# Patient Record
Sex: Male | Born: 1965 | Race: White | Hispanic: No | Marital: Married | Smoking: Never smoker
Health system: Southern US, Community
[De-identification: ages and names within clinical notes are randomized; demographics above are authoritative.]

## PROBLEM LIST (undated history)

## (undated) DIAGNOSIS — L739 Follicular disorder, unspecified: Secondary | ICD-10-CM

## (undated) DIAGNOSIS — IMO0002 Reserved for concepts with insufficient information to code with codable children: Secondary | ICD-10-CM

## (undated) DIAGNOSIS — M419 Scoliosis, unspecified: Secondary | ICD-10-CM

## (undated) DIAGNOSIS — E785 Hyperlipidemia, unspecified: Secondary | ICD-10-CM

## (undated) DIAGNOSIS — R740 Nonspecific elevation of levels of transaminase and lactic acid dehydrogenase [LDH]: Secondary | ICD-10-CM

## (undated) HISTORY — DX: Hyperlipidemia, unspecified: E78.5

## (undated) HISTORY — PX: WISDOM TOOTH EXTRACTION: SHX21

## (undated) HISTORY — PX: ADENOIDECTOMY: SUR15

## (undated) HISTORY — DX: Follicular disorder, unspecified: L73.9

## (undated) HISTORY — DX: Nonspecific elevation of levels of transaminase and lactic acid dehydrogenase (ldh): R74.0

## (undated) HISTORY — DX: Reserved for concepts with insufficient information to code with codable children: IMO0002

## (undated) HISTORY — DX: Scoliosis, unspecified: M41.9

---

## 1993-02-06 HISTORY — PX: COLONOSCOPY: SHX174

## 2006-10-01 ENCOUNTER — Ambulatory Visit: Payer: Self-pay | Admitting: Internal Medicine

## 2008-07-21 ENCOUNTER — Ambulatory Visit: Payer: Self-pay | Admitting: Internal Medicine

## 2008-07-21 DIAGNOSIS — E785 Hyperlipidemia, unspecified: Secondary | ICD-10-CM | POA: Insufficient documentation

## 2008-07-21 DIAGNOSIS — L738 Other specified follicular disorders: Secondary | ICD-10-CM | POA: Insufficient documentation

## 2008-07-21 LAB — CONVERTED CEMR LAB
Bilirubin Urine: NEGATIVE
Glucose, Urine, Semiquant: NEGATIVE
Ketones, urine, test strip: NEGATIVE
Nitrite: NEGATIVE
WBC Urine, dipstick: NEGATIVE

## 2009-09-06 DIAGNOSIS — R7401 Elevation of levels of liver transaminase levels: Secondary | ICD-10-CM

## 2009-09-06 DIAGNOSIS — R7402 Elevation of levels of lactic acid dehydrogenase (LDH): Secondary | ICD-10-CM

## 2009-09-06 HISTORY — DX: Elevation of levels of liver transaminase levels: R74.01

## 2009-09-06 HISTORY — DX: Elevation of levels of lactic acid dehydrogenase (LDH): R74.02

## 2009-09-21 ENCOUNTER — Ambulatory Visit: Payer: Self-pay | Admitting: Internal Medicine

## 2009-09-21 DIAGNOSIS — R3919 Other difficulties with micturition: Secondary | ICD-10-CM | POA: Insufficient documentation

## 2009-09-21 DIAGNOSIS — R17 Unspecified jaundice: Secondary | ICD-10-CM | POA: Insufficient documentation

## 2009-09-22 ENCOUNTER — Encounter: Payer: Self-pay | Admitting: Internal Medicine

## 2009-09-22 DIAGNOSIS — R740 Nonspecific elevation of levels of transaminase and lactic acid dehydrogenase [LDH]: Secondary | ICD-10-CM

## 2009-09-22 DIAGNOSIS — R7401 Elevation of levels of liver transaminase levels: Secondary | ICD-10-CM | POA: Insufficient documentation

## 2009-09-22 LAB — CONVERTED CEMR LAB
ALT: 474 units/L — ABNORMAL HIGH (ref 0–53)
AST: 162 units/L — ABNORMAL HIGH (ref 0–37)
Alkaline Phosphatase: 377 units/L — ABNORMAL HIGH (ref 39–117)
BUN: 13 mg/dL (ref 6–23)
Basophils Absolute: 0.1 10*3/uL (ref 0.0–0.1)
Basophils Relative: 1.2 % (ref 0.0–3.0)
Creatinine, Ser: 0.9 mg/dL (ref 0.4–1.5)
HCT: 42.7 % (ref 39.0–52.0)
HCV Ab: NEGATIVE
Hepatitis B Surface Ag: NEGATIVE
Lymphocytes Relative: 16.7 % (ref 12.0–46.0)
Lymphs Abs: 0.9 10*3/uL (ref 0.7–4.0)
MCHC: 34.8 g/dL (ref 30.0–36.0)
MCV: 98.4 fL (ref 78.0–100.0)
Monocytes Absolute: 0.5 10*3/uL (ref 0.1–1.0)
Neutro Abs: 3.4 10*3/uL (ref 1.4–7.7)
Neutrophils Relative %: 65.2 % (ref 43.0–77.0)
Platelets: 270 10*3/uL (ref 150.0–400.0)

## 2009-09-23 ENCOUNTER — Encounter: Admission: RE | Admit: 2009-09-23 | Discharge: 2009-09-23 | Payer: Self-pay | Admitting: Internal Medicine

## 2009-09-23 ENCOUNTER — Ambulatory Visit: Payer: Self-pay | Admitting: Internal Medicine

## 2009-09-24 LAB — CONVERTED CEMR LAB
ALT: 300 units/L — ABNORMAL HIGH (ref 0–53)
AST: 79 units/L — ABNORMAL HIGH (ref 0–37)
Alkaline Phosphatase: 289 units/L — ABNORMAL HIGH (ref 39–117)
Bilirubin, Direct: 0.7 mg/dL — ABNORMAL HIGH (ref 0.0–0.3)

## 2009-09-27 ENCOUNTER — Ambulatory Visit: Payer: Self-pay | Admitting: Internal Medicine

## 2009-09-28 LAB — CONVERTED CEMR LAB
Alkaline Phosphatase: 190 units/L — ABNORMAL HIGH (ref 39–117)
Bilirubin, Direct: 0.5 mg/dL — ABNORMAL HIGH (ref 0.0–0.3)

## 2009-10-06 ENCOUNTER — Ambulatory Visit: Payer: Self-pay | Admitting: Internal Medicine

## 2009-10-10 ENCOUNTER — Encounter: Payer: Self-pay | Admitting: Internal Medicine

## 2009-10-12 LAB — CONVERTED CEMR LAB
Albumin: 4.2 g/dL (ref 3.5–5.2)
Total Bilirubin: 1.2 mg/dL (ref 0.3–1.2)

## 2009-10-20 ENCOUNTER — Ambulatory Visit (HOSPITAL_COMMUNITY): Admission: RE | Admit: 2009-10-20 | Discharge: 2009-10-20 | Payer: Self-pay | Admitting: Internal Medicine

## 2010-03-06 LAB — CONVERTED CEMR LAB
Blood in Urine, dipstick: NEGATIVE
Protein, U semiquant: NEGATIVE
Urobilinogen, UA: NEGATIVE
WBC Urine, dipstick: NEGATIVE

## 2010-03-08 NOTE — Miscellaneous (Signed)
Summary: Orders Update   Clinical Lists Changes  Problems: Added new problem of NONSPEC ELEVATION OF LEVELS OF TRANSAMINASE/LDH (ICD-790.4) Orders: Added new Referral order of Radiology Referral (Radiology) - Signed 

## 2010-03-08 NOTE — Assessment & Plan Note (Signed)
Summary: urine dark- white of eyes yellow/cbs   Vital Signs:  Patient profile:   45 year old male Height:      69.25 inches (175.90 cm) Weight:      184.50 pounds (83.86 kg) BMI:     27.15 Temp:     98.3 degrees F (36.83 degrees C) oral Pulse rate:   88 / minute Resp:     14 per minute BP sitting:   118 / 80  (left arm) Cuff size:   large  Vitals Entered By: Brenton Grills MA (September 21, 2009 8:09 AM) CC: Dark Urine/Whites of Eyes Yellow after vacation/aj   CC:  Dark Urine/Whites of Eyes Yellow after vacation/aj.  History of Present Illness: His children noted "yellow " of his eyes 09/12/2009. On 09/09/2009 he had marked diarrhea X three   1 hr post lunch of seared Ahi tuna @ steak house in [facility]. No others  @ lunch got ill.On 08/05 he had fever to 101.2 sublingually; temp was 100 on Sat 08/06. He flew to [facility] 08/07; he had malaise that day  & night sweats that night. N&V X 1 on 08/07. ? icterus 08/7 & dark urine 08/08. Stools chalky 08/08 or 08/09. He had anorexia last week; N&V X1 on 08/11. He has felt better since. See ROS for present symptoms/ signs.No foreign travel in > 12 mony=ths.  Preventive Screening-Counseling & Management      Drug Use:  no.    Current Medications (verified): 1)  None  Allergies (verified): No Known Drug Allergies  Social History: no diet Occupation: Psychologist, educational Married Former Smoker: in college Alcohol use-yes: socially Regular exercise-no Drug use-no Drug Use:  no  Review of Systems General:  Denies chills, fever, and sweats; no significant weight loss overall. ENT:  Denies difficulty swallowing, hoarseness, nasal congestion, and sinus pressure. Resp:  Denies cough and sputum productive. GI:  Denies abdominal pain, bloody stools, dark tarry stools, and indigestion; Recent intoleance to greasy foods. GU:  Denies discharge, dysuria, and hematuria. MS:  Denies joint pain, joint redness, and joint swelling. Derm:  Denies lesion(s)  and rash.  Physical Exam  General:  well-nourished,in no acute distress; alert,appropriate and cooperative throughout examination Eyes:  No corneal or conjunctival inflammation noted.Perrla. No definite icterus Mouth:  Oral mucosa and oropharynx without lesions or exudates.  Teeth in good repair. No pharyngeal erythema.  Tongue moist Neck:  No deformities, masses, or tenderness noted. Lungs:  Normal respiratory effort, chest expands symmetrically. Lungs are clear to auscultation, no crackles or wheezes. Heart:  Normal rate and regular rhythm. S1 and S2 normal without gallop, murmur, click, rub or other extra sounds. Abdomen:  Bowel sounds positive,abdomen soft and non-tender  even to percussion without masses, organomegaly or hernias noted. Msk:  Asymmetry of paraspinus muscles , R > L Pulses:  R and L carotid,radial,dorsalis pedis and posterior tibial pulses are full and equal bilaterally Extremities:  No clubbing, cyanosis, edema, or deformity noted with normal full range of motion of all joints.   Neurologic:  alert & oriented X3.   Skin:  Intact without suspicious lesions or rashes. No jaundice. No tenting Cervical Nodes:  No lymphadenopathy noted Axillary Nodes:  No palpable lymphadenopathy Psych:  memory intact for recent and remote, normally interactive, and good eye contact.     Impression & Recommendations:  Problem # 1:  ICTERUS (ICD-782.4)  Clinically inapparent @ present  Orders: Venipuncture (16109) TLB-CBC Platelet - w/Differential (85025-CBCD) TLB-Hepatic/Liver Function Pnl (80076-HEPATIC) TLB-Udip  w/ Micro (81001-URINE) T-Hepatitis Acute Panel (30160-10932)  Problem # 2:  DIARRHEA (ICD-787.91) Assessment: Unchanged  resolved  Orders: Venipuncture (35573) TLB-CBC Platelet - w/Differential (85025-CBCD) TLB-Hepatic/Liver Function Pnl (80076-HEPATIC) TLB-Udip w/ Micro (81001-URINE) T-Hepatitis Acute Panel (22025-42706) TLB-Creatinine, Blood  (82565-CREA) TLB-Potassium (K+) (84132-K) TLB-BUN (Urea Nitrogen) (84520-BUN)  Problem # 3:  FEVER (ICD-780.60)  resolved  Orders: Venipuncture (23762) TLB-CBC Platelet - w/Differential (85025-CBCD) TLB-Hepatic/Liver Function Pnl (80076-HEPATIC) TLB-Udip w/ Micro (81001-URINE) T-Hepatitis Acute Panel (83151-76160)  Problem # 4:  DARK URINE (ICD-788.69)  resolving  Orders: Venipuncture (73710) TLB-CBC Platelet - w/Differential (85025-CBCD) TLB-Hepatic/Liver Function Pnl (80076-HEPATIC) TLB-Udip w/ Micro (81001-URINE) T-Hepatitis Acute Panel (62694-85462)  Patient Instructions: 1)  [place] of [place] will depend  upon these results.  Appended Document: urine dark- white of eyes yellow/cbs   Appended Document: urine dark- white of eyes yellow/cbs  Laboratory Results   Urine Tests   Date/Time Reported: September 21, 2009 10:00 AM   Routine Urinalysis   Color: orange Appearance: Clear Glucose: negative   (Normal Range: Negative) Bilirubin: negative   (Normal Range: Negative) Ketone: negative   (Normal Range: Negative) Spec. Gravity: 1.025   (Normal Range: 1.003-1.035) Blood: negative   (Normal Range: Negative) pH: 5.0   (Normal Range: 5.0-8.0) Protein: negative   (Normal Range: Negative) Urobilinogen: negative   (Normal Range: 0-1) Nitrite: negative   (Normal Range: Negative) Leukocyte Esterace: negative   (Normal Range: Negative)    Comments: Floydene Flock  September 21, 2009 10:00 AM

## 2010-03-08 NOTE — Miscellaneous (Signed)
Summary: Orders Update   Clinical Lists Changes  Orders: Added new Referral order of Radiology Referral (Radiology) - Signed 

## 2010-09-06 ENCOUNTER — Encounter: Payer: Self-pay | Admitting: Internal Medicine

## 2010-09-07 ENCOUNTER — Encounter: Payer: Self-pay | Admitting: Internal Medicine

## 2010-09-07 ENCOUNTER — Ambulatory Visit (INDEPENDENT_AMBULATORY_CARE_PROVIDER_SITE_OTHER): Payer: PRIVATE HEALTH INSURANCE | Admitting: Internal Medicine

## 2010-09-07 VITALS — BP 128/80 | HR 68 | Temp 98.4°F | Ht 69.25 in | Wt 185.0 lb

## 2010-09-07 DIAGNOSIS — Z0000 Encounter for general adult medical examination without abnormal findings: Secondary | ICD-10-CM

## 2010-09-07 DIAGNOSIS — E785 Hyperlipidemia, unspecified: Secondary | ICD-10-CM

## 2010-09-07 DIAGNOSIS — R7401 Elevation of levels of liver transaminase levels: Secondary | ICD-10-CM

## 2010-09-07 LAB — POCT URINALYSIS DIPSTICK
Bilirubin, UA: NEGATIVE
Glucose, UA: NEGATIVE
Leukocytes, UA: NEGATIVE
Nitrite, UA: NEGATIVE
Spec Grav, UA: 1.01
Urobilinogen, UA: 0.2
pH, UA: 7.5

## 2010-09-07 LAB — CBC WITH DIFFERENTIAL/PLATELET
Basophils Absolute: 0 10*3/uL (ref 0.0–0.1)
Eosinophils Absolute: 0.2 10*3/uL (ref 0.0–0.7)
Eosinophils Relative: 4 % (ref 0.0–5.0)
HCT: 43.8 % (ref 39.0–52.0)
Lymphs Abs: 1.3 10*3/uL (ref 0.7–4.0)
MCHC: 34.7 g/dL (ref 30.0–36.0)
MCV: 96.2 fl (ref 78.0–100.0)
Platelets: 218 10*3/uL (ref 150.0–400.0)
RDW: 13.1 % (ref 11.5–14.6)
WBC: 4.9 10*3/uL (ref 4.5–10.5)

## 2010-09-07 LAB — LIPID PANEL: Cholesterol: 272 mg/dL — ABNORMAL HIGH (ref 0–200)

## 2010-09-07 LAB — BASIC METABOLIC PANEL
BUN: 19 mg/dL (ref 6–23)
Calcium: 9 mg/dL (ref 8.4–10.5)
Creatinine, Ser: 1 mg/dL (ref 0.4–1.5)
Potassium: 4 mEq/L (ref 3.5–5.1)
Sodium: 141 mEq/L (ref 135–145)

## 2010-09-07 LAB — TSH: TSH: 0.35 u[IU]/mL (ref 0.35–5.50)

## 2010-09-07 NOTE — Progress Notes (Signed)
Subjective:    Patient ID: Preston Lambert, male    DOB: 08-23-1965, 45 y.o.   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     Review of Systems  While on vacation in the [place] in August of 2011 he had scleral icterus and dark urine without abdominal pain. Liver enzymes were elevated. His ALT was 474,AST 162,& Alkaline phosphatase  379. An extensive evaluation was negative including a papida  gallbladder scan. By August 31 his ALT , 38 , AST 24 &  alkaline phosphatase 116. He is asymptomatic. Patient reports no  vision/ hearing changes,anorexia, weight change, fever ,adenopathy, persistant / recurrent hoarseness, swallowing issues, chest pain,palpitations, edema,persistant / recurrent cough, hemoptysis, dyspnea(rest, exertional, paroxysmal nocturnal), gastrointestinal  bleeding (melena, rectal bleeding), abdominal pain, excessive heart burn, GU symptoms( dysuria, hematuria, pyuria, color change) syncope, focal weakness, memory loss,numbness & tingling, skin/hair/nail changes,depression, anxiety, abnormal bruising/bleeding, or  musculoskeletal symptoms/signs.      Objective:   Physical Exam Gen.: Healthy and well-nourished in appearance. Alert, appropriate and cooperative throughout exam. Head: Normocephalic without obvious abnormalities;  No  alopecia  Eyes: No corneal or conjunctival inflammation noted. Pupils equal round reactive to light and accommodation. Fundal exam is benign without hemorrhages, exudate, papilledema. Extraocular motion intact. Vision grossly normal. No icterus Ears: External  ear exam reveals no significant lesions or deformities. Canals clear .TMs normal. Hearing is grossly normal bilaterally. Nose: External nasal exam reveals no deformity or inflammation. Nasal mucosa are pink and moist. No lesions or exudates noted. Septum  normal  Mouth: Oral mucosa and oropharynx reveal no lesions or exudates. Teeth in good  repair. Neck: No deformities, masses, or tenderness noted. Range of motion &. Thyroid normal. Lungs: Normal respiratory effort; chest expands symmetrically. Lungs are clear to auscultation without rales, wheezes, or increased work of breathing. Heart: Normal rate and rhythm. Normal S1 and S2. No gallop, click, or rub. No  murmur. Abdomen: Bowel sounds normal; abdomen soft and nontender. No masses, organomegaly or hernias noted. Genitalia: normal. DRE deferred   .                                                                                   Musculoskeletal/extremities: No deformity or scoliosis noted of  the thoracic or lumbar spine but asymmetry of thoracic muscles (R>L)Note: PMH of scoliosis. No clubbing, cyanosis, edema, or deformity noted. Range of motion  normal .Tone & strength  normal.Joints normal. Nail health  good. Vascular: Carotid, radial artery, dorsalis pedis and  posterior tibial pulses are full and equal. No bruits present. Neurologic: Alert and oriented x3. Deep tendon reflexes symmetrical and normal.          Skin: Intact without suspicious lesions or rashes. No jaundice Lymph: No cervical, axillary, or inguinal lymphadenopathy present. Psych: Mood and affect are normal. Normally interactive  Assessment & Plan:  #1 comprehensive physical exam; no acute findings #2 see Problem List with Assessments & Recommendations #3 past medical history of self-limited jaundice and liver enzyme rise in August/2011. Plan: see Orders  Note:EKG normal

## 2010-09-07 NOTE — Patient Instructions (Addendum)
Preventive Health Care: Exercise at least 30-45 minutes a day,  3-4 days a week.  Eat a low-fat diet with lots of fruits and vegetables, up to 7-9 servings per day. Consume less than 40 grams of sugar per day from foods & drinks with High Fructose Corn Sugar as #1,2,3 or # 4 on label. To prevent palpitations or premature beats, avoid stimulants such as decongestants, diet pills, nicotine, or caffeine (coffee, tea, cola, or chocolate) to excess.

## 2010-09-08 LAB — HEPATIC FUNCTION PANEL
ALT: 25 U/L (ref 0–53)
AST: 18 U/L (ref 0–37)
Alkaline Phosphatase: 63 U/L (ref 39–117)
Total Protein: 7.1 g/dL (ref 6.0–8.3)

## 2010-09-08 LAB — LDL CHOLESTEROL, DIRECT: Direct LDL: 191.1 mg/dL

## 2011-05-08 ENCOUNTER — Ambulatory Visit (INDEPENDENT_AMBULATORY_CARE_PROVIDER_SITE_OTHER): Payer: PRIVATE HEALTH INSURANCE | Admitting: Internal Medicine

## 2011-05-08 ENCOUNTER — Encounter: Payer: Self-pay | Admitting: Internal Medicine

## 2011-05-08 VITALS — BP 122/68 | HR 69 | Temp 98.7°F | Resp 18 | Ht 70.0 in | Wt 184.0 lb

## 2011-05-08 DIAGNOSIS — E785 Hyperlipidemia, unspecified: Secondary | ICD-10-CM

## 2011-05-08 DIAGNOSIS — R7401 Elevation of levels of liver transaminase levels: Secondary | ICD-10-CM

## 2011-05-08 NOTE — Progress Notes (Signed)
  Subjective:    Patient ID: Preston Lambert, male    DOB: April 15, 1965, 46 y.o.   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      Review of Systems Patient reports no  vision/ hearing changes,anorexia, weight change, fever ,adenopathy, persistant / recurrent hoarseness, swallowing issues, chest pain,palpitations, edema,persistant / recurrent cough, hemoptysis, dyspnea(rest, exertional, paroxysmal nocturnal), gastrointestinal  bleeding (melena, rectal bleeding), abdominal pain, excessive heart burn, GU symptoms( dysuria, hematuria, pyuria, voiding/incontinence  issues) syncope, focal weakness, memory loss,numbness & tingling, skin/hair/nail changes,depression, anxiety, abnormal bruising/bleeding,or  musculoskeletal symptoms/signs.      Objective:   Physical Exam Gen.: Healthy and well-nourished in appearance. Alert, appropriate and cooperative throughout exam. Head: Normocephalic without obvious abnormalities;  no alopecia  Eyes: No corneal or conjunctival inflammation noted. Pupils equal round reactive to light and accommodation. Fundal exam is benign without hemorrhages, exudate, papilledema. Extraocular motion intact. Vision grossly normal.FOV normal Ears: External  ear exam reveals no significant lesions or deformities. Canals clear .TMs normal. Hearing is grossly normal bilaterally. Nose: External nasal exam reveals no deformity or inflammation. Nasal mucosa are pink and moist. No lesions or exudates noted.   Mouth: Oral mucosa and oropharynx reveal no lesions or exudates. Teeth in good repair. Neck: No deformities, masses, or tenderness noted. Range of motion & Thyroid normal. Lungs: Normal respiratory effort; chest expands symmetrically. Lungs are clear to auscultation without rales, wheezes, or increased work of breathing. Heart: Normal rate and rhythm. Normal S1 and S2. No gallop, click, or rub. S 4 w/o murmur. Abdomen:  Bowel sounds normal; abdomen soft and nontender. No masses, organomegaly or hernias noted. Genitalia/ DRE: Granuloma L > R ; prostate is upper limits of normal without asymmetry, nodularity, or induration. Musculoskeletal/extremities: Mild asymmetry  noted of  the thoracic musculature. No clubbing, cyanosis, edema, or deformity noted. Range of motion  normal .Tone & strength  normal.Joints normal. Nail health  good. Vascular: Carotid, radial artery, dorsalis pedis and  posterior tibial pulses are full and equal. No bruits present. Neurologic: Alert and oriented x3. Deep tendon reflexes symmetrical and normal.          Skin: Intact without suspicious lesions or rashes. Lymph: No cervical, axillary, or inguinal lymphadenopathy present. Psych: Mood and affect are normal. Normally interactive                                                                                         Assessment & Plan:  #1 comprehensive physical exam; no acute findings #2 see Problem List with Assessments & Recommendations Plan: see Orders

## 2011-05-08 NOTE — Patient Instructions (Addendum)
Please review Dr Gildardo Griffes book Eat, Drink & Be Healthy for dietary cholesterol information. Please  schedule fasting Labs : NMR Lipoprofile Lipid Panel & hepatic panel  after 4 months of dietary changes to optimally assess LDL risk.  PLEASE BRING THESE INSTRUCTIONS TO FOLLOW UP  LAB APPOINTMENT.This will guarantee correct labs are drawn, eliminating need for repeat blood sampling ( needle sticks ! ). Diagnoses /Codes: 272.4,790.4 Exercise at least 30-45 minutes a day,  3-4 days a week.  Eat a low-fat diet with lots of fruits and vegetables, up to 7-9 servings per day. Consume less than 40 grams of sugar per day from foods & drinks with High Fructose Corn Sugar as # 1,2,3 or # 4 on label.

## 2013-04-15 ENCOUNTER — Encounter: Payer: Self-pay | Admitting: Emergency Medicine

## 2013-04-15 ENCOUNTER — Emergency Department (INDEPENDENT_AMBULATORY_CARE_PROVIDER_SITE_OTHER)
Admission: EM | Admit: 2013-04-15 | Discharge: 2013-04-15 | Disposition: A | Discharge status: Home / Self Care | Payer: No Typology Code available for payment source | Source: Home / Self Care | Attending: Emergency Medicine | Admitting: Emergency Medicine

## 2013-04-15 DIAGNOSIS — J0100 Acute maxillary sinusitis, unspecified: Secondary | ICD-10-CM

## 2013-04-15 MED ORDER — AMOXICILLIN-POT CLAVULANATE 875-125 MG PO TABS: 1.0000 | ORAL_TABLET | Freq: Two times a day (BID) | ORAL | Status: DC

## 2013-04-15 MED ORDER — PROMETHAZINE-CODEINE 6.25-10 MG/5ML PO SYRP: ORAL_SOLUTION | ORAL | Status: DC

## 2013-04-15 NOTE — ED Notes (Signed)
Pt c/o nasal congestion, productive cough and hoarseness x 1 day. He reports having a URI 3wks ago that has resolved. Denies fever. He took Sudafed yesterday.
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
## 2013-08-12 ENCOUNTER — Encounter: Payer: Self-pay | Admitting: Internal Medicine

## 2013-08-12 ENCOUNTER — Ambulatory Visit (INDEPENDENT_AMBULATORY_CARE_PROVIDER_SITE_OTHER): Payer: No Typology Code available for payment source | Admitting: Internal Medicine

## 2013-08-12 VITALS — BP 142/72 | HR 84 | Temp 98.0°F | Ht 69.25 in | Wt 184.8 lb

## 2013-08-12 DIAGNOSIS — R9431 Abnormal electrocardiogram [ECG] [EKG]: Secondary | ICD-10-CM

## 2013-08-12 DIAGNOSIS — E785 Hyperlipidemia, unspecified: Secondary | ICD-10-CM

## 2013-08-12 DIAGNOSIS — Z0000 Encounter for general adult medical examination without abnormal findings: Secondary | ICD-10-CM

## 2013-08-12 NOTE — Progress Notes (Signed)
Pre visit review using our clinic review tool, if applicable. No additional management support is needed unless otherwise documented below in the visit note. 

## 2013-08-12 NOTE — Patient Instructions (Addendum)
Your next office appointment will be determined based upon review of your pending labs . Those instructions will be transmitted to you through My Chart  OR  by mail;whichever process is your choice to receive results & recommendations . Minimal Blood Pressure Goal= AVERAGE < 140/90;  Ideal is an AVERAGE < 135/85. This AVERAGE should be calculated from @ least 5-7 BP readings taken @ different times of day on different days of week. You should not respond to isolated BP readings , but rather the AVERAGE for that week .Please bring your  blood pressure cuff to office visits to verify that it is reliable.It  can also be checked against the blood pressure device at the pharmacy. Finger or wrist cuffs are not dependable; an arm cuff is.  Take the EKG to any emergency room or preop visits. There are nonspecific changes; as long as there is no new change these are not clinically significant . If the old EKG is not available for comparison; it may result in unnecessary hospitalization for observation with significant unnecessary expense. 

## 2013-08-12 NOTE — Progress Notes (Signed)
   Subjective:    Patient ID: Preston Lambert, male    DOB: August 06, 1965, 48 y.o.   MRN: [id number]  HPI  He is here for a physical;acute issues include intermittent fatigue & ED w/o muscle weakness or loss of libido.  A modified heart healthy diet is followed;minimsal exercise. Family history is negative for premature coronary disease. Advanced cholesterol testing never done.To date no statin.  Low dose ASA not taken  Review of Systems  Specifically denied are  chest pain, palpitations, dyspnea, or claudication.       Objective:   Physical Exam Gen.: Healthy and well-nourished in appearance. Alert, appropriate and cooperative throughout exam. Appears younger than stated age  Head: Normocephalic without obvious abnormalities; hair very fine w/o alopecia  Eyes: No corneal or conjunctival inflammation noted. Pupils equal round reactive to light and accommodation. Extraocular motion intact. Ears: External  ear exam reveals no significant lesions or deformities. Canals clear .TMs normal. Hearing is grossly normal bilaterally. Nose: External nasal exam reveals no deformity or inflammation. Nasal mucosa are pink and moist. No lesions or exudates noted.   Mouth: Oral mucosa and oropharynx reveal no lesions or exudates. Teeth in good repair. Neck: No deformities, masses, or tenderness noted. Range of motion & Thyroid normal. Lungs: Normal respiratory effort; chest expands symmetrically. Lungs are clear to auscultation without rales, wheezes, or increased work of breathing. Heart: Normal rate and rhythm. Normal S1 and S2. No gallop, click, or rub. No murmur. Abdomen: Bowel sounds normal; abdomen soft and nontender. No masses, organomegaly or hernias noted. Genitalia:Genitalia normal except for left varices & granuloma. Prostate is normal without enlargement, asymmetry, nodularity, or induration                          Musculoskeletal/extremities: No deformity or scoliosis noted of  the thoracic  or lumbar spine.  No clubbing, cyanosis, edema, or significant extremity  deformity noted. Range of motion normal .Tone & strength normal. Hand joints normal.  Fingernail health good. Able to lie down & sit up w/o help. Negative SLR bilaterally Vascular: Carotid, radial artery, dorsalis pedis and  posterior tibial pulses are full and equal. No bruits present. Neurologic: Alert and oriented x3. Deep tendon reflexes symmetrical and normal.  Gait normal.      Skin: Intact without suspicious lesions or rashes. Lymph: No cervical, axillary, or inguinal lymphadenopathy present. Psych: Mood and affect are normal. Normally interactive                                                                                        Assessment & Plan:  #1 comprehensive physical exam; no acute findings  Plan: see Orders  & Recommendations

## 2013-08-28 ENCOUNTER — Other Ambulatory Visit (INDEPENDENT_AMBULATORY_CARE_PROVIDER_SITE_OTHER): Payer: No Typology Code available for payment source

## 2013-08-28 ENCOUNTER — Other Ambulatory Visit: Payer: Self-pay | Admitting: Internal Medicine

## 2013-08-28 DIAGNOSIS — Z0000 Encounter for general adult medical examination without abnormal findings: Secondary | ICD-10-CM

## 2013-08-28 LAB — BASIC METABOLIC PANEL
BUN: 15 mg/dL (ref 6–23)
CALCIUM: 9.3 mg/dL (ref 8.4–10.5)
CO2: 29 mEq/L (ref 19–32)
CREATININE: 1 mg/dL (ref 0.4–1.5)
Chloride: 106 mEq/L (ref 96–112)
GFR: 89.19 mL/min (ref >60.00)
GLUCOSE: 87 mg/dL (ref 70–99)
Potassium: 4.1 mEq/L (ref 3.5–5.1)
SODIUM: 142 meq/L (ref 135–145)

## 2013-08-28 LAB — CBC WITH DIFFERENTIAL/PLATELET
BASOS ABS: 0 10*3/uL (ref 0.0–0.1)
BASOS PCT: 0.6 % (ref 0.0–3.0)
EOS PCT: 4.2 % (ref 0.0–5.0)
Eosinophils Absolute: 0.2 10*3/uL (ref 0.0–0.7)
HCT: 45.1 % (ref 39.0–52.0)
HEMOGLOBIN: 15.7 g/dL (ref 13.0–17.0)
LYMPHS PCT: 23.9 % (ref 12.0–46.0)
Lymphs Abs: 1.3 10*3/uL (ref 0.7–4.0)
MCHC: 34.8 g/dL (ref 30.0–36.0)
MCV: 94.5 fl (ref 78.0–100.0)
MONO ABS: 0.4 10*3/uL (ref 0.1–1.0)
MONOS PCT: 7.5 % (ref 3.0–12.0)
NEUTROS PCT: 63.8 % (ref 43.0–77.0)
Neutro Abs: 3.6 10*3/uL (ref 1.4–7.7)
Platelets: 236 10*3/uL (ref 150.0–400.0)
RBC: 4.77 Mil/uL (ref 4.22–5.81)
RDW: 13.3 % (ref 11.5–15.5)
WBC: 5.7 10*3/uL (ref 4.0–10.5)

## 2013-08-28 LAB — HEPATIC FUNCTION PANEL
ALBUMIN: 4.3 g/dL (ref 3.5–5.2)
ALK PHOS: 60 U/L (ref 39–117)
ALT: 22 U/L (ref 0–53)
AST: 17 U/L (ref 0–37)
BILIRUBIN DIRECT: 0.1 mg/dL (ref 0.0–0.3)
TOTAL PROTEIN: 6.8 g/dL (ref 6.0–8.3)
Total Bilirubin: 0.9 mg/dL (ref 0.2–1.2)

## 2013-08-28 LAB — TSH: TSH: 0.86 u[IU]/mL (ref 0.35–4.50)

## 2013-08-30 LAB — NMR LIPOPROFILE WITH LIPIDS
Cholesterol, Total: 255 mg/dL — ABNORMAL HIGH (ref <200)
HDL Particle Number: 31.8 umol/L (ref >=30.5)
HDL SIZE: 8.2 nm — AB (ref >=9.2)
HDL-C: 49 mg/dL (ref >=40)
LARGE VLDL-P: 4.7 nmol/L — AB (ref <=2.7)
LDL CALC: 163 mg/dL — AB (ref <100)
LDL Particle Number: 2246 nmol/L — ABNORMAL HIGH (ref <1000)
LDL Size: 21.1 nm (ref >20.5)
LP-IR Score: 65 — ABNORMAL HIGH (ref <=45)
Large HDL-P: <1.3 umol/L — ABNORMAL LOW (ref >=4.8)
SMALL LDL PARTICLE NUMBER: 759 nmol/L — AB (ref <=527)
Triglycerides: 214 mg/dL — ABNORMAL HIGH (ref <150)
VLDL SIZE: 44.5 nm (ref <=46.6)

## 2013-08-31 ENCOUNTER — Encounter: Payer: Self-pay | Admitting: Internal Medicine

## 2013-10-24 ENCOUNTER — Encounter: Payer: Self-pay | Admitting: Emergency Medicine

## 2013-10-24 ENCOUNTER — Emergency Department (INDEPENDENT_AMBULATORY_CARE_PROVIDER_SITE_OTHER)
Admission: EM | Admit: 2013-10-24 | Discharge: 2013-10-24 | Disposition: A | Discharge status: Home / Self Care | Payer: No Typology Code available for payment source | Source: Home / Self Care | Attending: Family Medicine | Admitting: Family Medicine

## 2013-10-24 DIAGNOSIS — J111 Influenza due to unidentified influenza virus with other respiratory manifestations: Secondary | ICD-10-CM

## 2013-10-24 DIAGNOSIS — R69 Illness, unspecified: Principal | ICD-10-CM

## 2013-10-24 MED ORDER — OSELTAMIVIR PHOSPHATE 75 MG PO CAPS: 75.0000 mg | ORAL_CAPSULE | Freq: Two times a day (BID) | ORAL | Status: DC

## 2013-10-24 NOTE — Discharge Instructions (Signed)
Thank you for coming in today. Take Tamiflu twice daily for 5 days Use Tylenol or ibuprofen as needed for pain fevers or chills Call or go to the emergency room if you get worse, have trouble breathing, have chest pains, or palpitations.   Influenza Influenza ("the flu") is a viral infection of the respiratory tract. It occurs more often in winter months because people spend more time in close contact with one another. Influenza can make you feel very sick. Influenza easily spreads from person to person (contagious). CAUSES  Influenza is caused by a virus that infects the respiratory tract. You can catch the virus by breathing in droplets from an infected person's cough or sneeze. You can also catch the virus by touching something that was recently contaminated with the virus and then touching your mouth, nose, or eyes. RISKS AND COMPLICATIONS You may be at risk for a more severe case of influenza if you smoke cigarettes, have diabetes, have chronic heart disease (such as heart failure) or lung disease (such as asthma), or if you have a weakened immune system. Elderly people and pregnant women are also at risk for more serious infections. The most common problem of influenza is a lung infection (pneumonia). Sometimes, this problem can require emergency medical care and may be life threatening. SIGNS AND SYMPTOMS  Symptoms typically last 4 to 10 days and may include:  Fever.  Chills.  Headache, body aches, and muscle aches.  Sore throat.  Chest discomfort and cough.  Poor appetite.  Weakness or feeling tired.  Dizziness.  Nausea or vomiting. DIAGNOSIS  Diagnosis of influenza is often made based on your history and a physical exam. A nose or throat swab test can be done to confirm the diagnosis. TREATMENT  In mild cases, influenza goes away on its own. Treatment is directed at relieving symptoms. For more severe cases, your health care provider may prescribe antiviral medicines to  shorten the sickness. Antibiotic medicines are not effective because the infection is caused by a virus, not by bacteria. HOME CARE INSTRUCTIONS  Take medicines only as directed by your health care provider.  Use a cool mist humidifier to make breathing easier.  Get plenty of rest until your temperature returns to normal. This usually takes 3 to 4 days.  Drink enough fluid to keep your urine clear or pale yellow.  Cover yourmouth and nosewhen coughing or sneezing,and wash your handswellto prevent thevirusfrom spreading.  Stay homefromwork orschool untilthe fever is gonefor at least 7full day. PREVENTION  An annual influenza vaccination (flu shot) is the best way to avoid getting influenza. An annual flu shot is now routinely recommended for all adults in the [place] SEEK MEDICAL CARE IF:  You experiencechest pain, yourcough worsens,or you producemore mucus.  Youhave nausea,vomiting, ordiarrhea.  Your fever returns or gets worse. SEEK IMMEDIATE MEDICAL CARE IF:  You havetrouble breathing, you become short of breath,or your skin ornails becomebluish.  You have severe painor stiffnessin the neck.  You develop a sudden headache, or pain in the face or ear.  You have nausea or vomiting that you cannot control. MAKE SURE YOU:   Understand these instructions.  Will watch your condition.  Will get help right away if you are not doing well or get worse. Document Released: 01/21/2000 Document Revised: 06/09/2013 Document Reviewed: 04/24/2011 [employer] Patient Information 2015 [place], [place]. This information is not intended to replace advice given to you by your health care provider. Make sure you discuss any questions you have with  your health care provider. ° °

## 2013-10-24 NOTE — ED Notes (Signed)
Came home from work with low grade fever today; took advil at 1300. Also started on rx of sulfa antibiotic from dermatologist and has taken 3 doses.
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
## 2014-03-09 LAB — HM COLONOSCOPY

## 2014-03-23 ENCOUNTER — Encounter: Payer: Self-pay | Admitting: Internal Medicine

## 2014-03-23 DIAGNOSIS — Z86010 Personal history of colonic polyps: Secondary | ICD-10-CM | POA: Insufficient documentation

## 2014-03-23 DIAGNOSIS — Z860101 Personal history of adenomatous and serrated colon polyps: Secondary | ICD-10-CM | POA: Insufficient documentation

## 2014-08-17 ENCOUNTER — Ambulatory Visit (INDEPENDENT_AMBULATORY_CARE_PROVIDER_SITE_OTHER): Payer: PRIVATE HEALTH INSURANCE | Admitting: Sports Medicine

## 2014-08-17 ENCOUNTER — Encounter: Payer: Self-pay | Admitting: Sports Medicine

## 2014-08-17 VITALS — BP 118/73 | HR 63 | Ht 70.0 in | Wt 177.0 lb

## 2014-08-17 DIAGNOSIS — Z23 Encounter for immunization: Secondary | ICD-10-CM

## 2014-08-17 DIAGNOSIS — R740 Nonspecific elevation of levels of transaminase and lactic acid dehydrogenase [LDH]: Secondary | ICD-10-CM | POA: Diagnosis not present

## 2014-08-17 DIAGNOSIS — Z13220 Encounter for screening for lipoid disorders: Secondary | ICD-10-CM

## 2014-08-17 DIAGNOSIS — R9431 Abnormal electrocardiogram [ECG] [EKG]: Secondary | ICD-10-CM

## 2014-08-17 DIAGNOSIS — Z0000 Encounter for general adult medical examination without abnormal findings: Secondary | ICD-10-CM | POA: Insufficient documentation

## 2014-08-17 DIAGNOSIS — R7401 Elevation of levels of liver transaminase levels: Secondary | ICD-10-CM

## 2014-08-17 NOTE — Assessment & Plan Note (Signed)
No anginal symptoms, EKG does not need to be followed up on.

## 2014-08-17 NOTE — Progress Notes (Signed)
  Subjective:    CC: Establish care.   HPI:  Hyperlipidemia: Due for a recheck  History of transaminitis: This occurred after a trip overseas, and up with an elevated AST, ALT, and a total bilirubin of 2.4 with visible jaundice, this resolved on its own with another provider. Has not yet had hepatitis viral serologies checked.  Preventive measures: Up-to-date on colonoscopy, due for a tetanus vaccine.  Past medical history, Surgical history, Family history not pertinant except as noted below, Social history, Allergies, and medications have been entered into the medical record, reviewed, and no changes needed.   Review of Systems: No headache, visual changes, nausea, vomiting, diarrhea, constipation, dizziness, abdominal pain, skin rash, fevers, chills, night sweats, swollen lymph nodes, weight loss, chest pain, body aches, joint swelling, muscle aches, shortness of breath, mood changes, visual or auditory hallucinations.  Objective:    General: Well Developed, well nourished, and in no acute distress.  Neuro: Alert and oriented x3, extra-ocular muscles intact, sensation grossly intact.  HEENT: Normocephalic, atraumatic, pupils equal round reactive to light, neck supple, no masses, no lymphadenopathy, thyroid nonpalpable.  Skin: Warm and dry, no rashes noted.  Cardiac: Regular rate and rhythm, no murmurs rubs or gallops.  Respiratory: Clear to auscultation bilaterally. Not using accessory muscles, speaking in full sentences.  Abdominal: Soft, nontender, nondistended, positive bowel sounds, no masses, no organomegaly.  Musculoskeletal: Shoulder, elbow, wrist, hip, knee, ankle stable, and with full range of motion.  Impression and Recommendations:    The patient was counselled, risk factors were discussed, anticipatory guidance given.

## 2014-08-17 NOTE — Assessment & Plan Note (Signed)
Episode of jaundice and transaminitis, with elevated total bilirubin after a trip overseas was likely due to hepatitis A infection. We are going to recheck his transaminases, as well as acute hepatitis panel looking for viral etiologies.

## 2014-08-17 NOTE — Assessment & Plan Note (Addendum)
Doing well, return for complete physical, blood work ordered. TDap today Up-to-date on colonoscopy.

## 2014-08-18 LAB — COMPLETE METABOLIC PANEL WITHOUT GFR
ALT: 16 U/L (ref 0–53)
Alkaline Phosphatase: 59 U/L (ref 39–117)
Calcium: 9 mg/dL (ref 8.4–10.5)
GFR, Est African American: >89 mL/min
GFR, Est Non African American: >89 mL/min
Potassium: 4 meq/L (ref 3.5–5.3)
Sodium: 142 meq/L (ref 135–145)
Total Protein: 6 g/dL (ref 6.0–8.3)

## 2014-08-18 LAB — COMPLETE METABOLIC PANEL WITH GFR
AST: 15 U/L (ref 0–37)
Albumin: 4.3 g/dL (ref 3.5–5.2)
BUN: 18 mg/dL (ref 6–23)
CO2: 27 mEq/L (ref 19–32)
Chloride: 106 mEq/L (ref 96–112)
Creat: 0.93 mg/dL (ref 0.50–1.35)
Glucose, Bld: 91 mg/dL (ref 70–99)
Total Bilirubin: 0.8 mg/dL (ref 0.2–1.2)

## 2014-08-18 LAB — LIPID PANEL
Cholesterol: 230 mg/dL — ABNORMAL HIGH (ref 0–200)
HDL: 51 mg/dL (ref >=40)
LDL Cholesterol: 151 mg/dL — ABNORMAL HIGH (ref 0–99)
Total CHOL/HDL Ratio: 4.5 Ratio
Triglycerides: 139 mg/dL (ref <150)
VLDL: 28 mg/dL (ref 0–40)

## 2014-08-18 LAB — CBC
HCT: 41.9 % (ref 39.0–52.0)
Hemoglobin: 14.9 g/dL (ref 13.0–17.0)
MCH: 33.1 pg (ref 26.0–34.0)
MCHC: 35.6 g/dL (ref 30.0–36.0)
MCV: 93.1 fL (ref 78.0–100.0)
MPV: 9.4 fL (ref 8.6–12.4)
Platelets: 202 10*3/uL (ref 150–400)
RBC: 4.5 MIL/uL (ref 4.22–5.81)
RDW: 13.8 % (ref 11.5–15.5)
WBC: 4.5 K/uL (ref 4.0–10.5)

## 2014-08-18 LAB — TSH: TSH: 1.575 u[IU]/mL (ref 0.350–4.500)

## 2014-08-19 LAB — HEPATITIS PANEL, ACUTE
HCV Ab: NEGATIVE
Hep A IgM: NONREACTIVE
Hep B C IgM: NONREACTIVE
Hepatitis B Surface Ag: NEGATIVE

## 2014-08-20 ENCOUNTER — Encounter: Payer: Self-pay | Admitting: Sports Medicine

## 2014-08-20 ENCOUNTER — Ambulatory Visit (INDEPENDENT_AMBULATORY_CARE_PROVIDER_SITE_OTHER): Payer: PRIVATE HEALTH INSURANCE | Admitting: Sports Medicine

## 2014-08-20 VITALS — BP 122/72 | HR 64 | Ht 69.0 in | Wt 178.0 lb

## 2014-08-20 DIAGNOSIS — H6982 Other specified disorders of Eustachian tube, left ear: Secondary | ICD-10-CM

## 2014-08-20 DIAGNOSIS — Z1211 Encounter for screening for malignant neoplasm of colon: Secondary | ICD-10-CM | POA: Diagnosis not present

## 2014-08-20 DIAGNOSIS — H6992 Unspecified Eustachian tube disorder, left ear: Secondary | ICD-10-CM | POA: Insufficient documentation

## 2014-08-20 DIAGNOSIS — Z0000 Encounter for general adult medical examination without abnormal findings: Secondary | ICD-10-CM | POA: Diagnosis not present

## 2014-08-20 DIAGNOSIS — E785 Hyperlipidemia, unspecified: Secondary | ICD-10-CM

## 2014-08-20 LAB — HEMOCCULT GUIAC POC 1CARD (OFFICE): Fecal Occult Blood, POC: NEGATIVE

## 2014-08-20 MED ORDER — FLUTICASONE PROPIONATE 50 MCG/ACT NA SUSP: 2.0000 | Freq: Two times a day (BID) | NASAL | Status: DC

## 2014-08-20 MED ORDER — PHENYLEPHRINE HCL 10 MG PO TABS: 10.0000 mg | ORAL_TABLET | Freq: Three times a day (TID) | ORAL | Status: DC

## 2014-08-20 MED ORDER — RED YEAST RICE 600 MG PO TABS: 2.0000 | ORAL_TABLET | Freq: Three times a day (TID) | ORAL | Status: DC

## 2014-08-20 NOTE — Patient Instructions (Signed)
Fat and Cholesterol Control Diet Fat and cholesterol levels in your blood and organs are influenced by your diet. High levels of fat and cholesterol may lead to diseases of the heart, small and large blood vessels, gallbladder, liver, and pancreas. CONTROLLING FAT AND CHOLESTEROL WITH DIET Although exercise and lifestyle factors are important, your diet is key. That is because certain foods are known to raise cholesterol and others to lower it. The goal is to balance foods for their effect on cholesterol and more importantly, to replace saturated and trans fat with other types of fat, such as monounsaturated fat, polyunsaturated fat, and omega-3 fatty acids. On average, a person should consume no more than 15 to 17 g of saturated fat daily. Saturated and trans fats are considered "bad" fats, and they will raise LDL cholesterol. Saturated fats are primarily found in animal products such as meats, butter, and cream. However, that does not mean you need to give up all your favorite foods. Today, there are good tasting, low-fat, low-cholesterol substitutes for most of the things you like to eat. Choose low-fat or nonfat alternatives. Choose round or loin cuts of red meat. These types of cuts are lowest in fat and cholesterol. Chicken (without the skin), fish, veal, and ground turkey breast are great choices. Eliminate fatty meats, such as hot dogs and salami. Even shellfish have little or no saturated fat. Have a 3 oz (85 g) portion when you eat lean meat, poultry, or fish. Trans fats are also called "partially hydrogenated oils." They are oils that have been scientifically manipulated so that they are solid at room temperature resulting in a longer shelf life and improved taste and texture of foods in which they are added. Trans fats are found in stick margarine, some tub margarines, cookies, crackers, and baked goods.  When baking and cooking, oils are a great substitute for butter. The monounsaturated oils are  especially beneficial since it is believed they lower LDL and raise HDL. The oils you should avoid entirely are saturated tropical oils, such as coconut and palm.  Remember to eat a lot from food groups that are naturally free of saturated and trans fat, including fish, fruit, vegetables, beans, grains (barley, rice, couscous, bulgur wheat), and pasta (without cream sauces).  IDENTIFYING FOODS THAT LOWER FAT AND CHOLESTEROL  Soluble fiber may lower your cholesterol. This type of fiber is found in fruits such as apples, vegetables such as broccoli, potatoes, and carrots, legumes such as beans, peas, and lentils, and grains such as barley. Foods fortified with plant sterols (phytosterol) may also lower cholesterol. You should eat at least 2 g per day of these foods for a cholesterol lowering effect.  Read package labels to identify low-saturated fats, trans fat free, and low-fat foods at the supermarket. Select cheeses that have only 2 to 3 g saturated fat per ounce. Use a heart-healthy tub margarine that is free of trans fats or partially hydrogenated oil. When buying baked goods (cookies, crackers), avoid partially hydrogenated oils. Breads and muffins should be made from whole grains (whole-wheat or whole oat flour, instead of "flour" or "enriched flour"). Buy non-creamy canned soups with reduced salt and no added fats.  FOOD PREPARATION TECHNIQUES  Never deep-fry. If you must fry, either stir-fry, which uses very little fat, or use non-stick cooking sprays. When possible, broil, bake, or roast meats, and steam vegetables. Instead of putting butter or margarine on vegetables, use lemon and herbs, applesauce, and cinnamon (for squash and sweet potatoes). Use nonfat   yogurt, salsa, and low-fat dressings for salads.  LOW-SATURATED FAT / LOW-FAT FOOD SUBSTITUTES Meats / Saturated Fat (g)  Avoid: Steak, marbled (3 oz/85 g) / 11 g  Choose: Steak, lean (3 oz/85 g) / 4 g  Avoid: Hamburger (3 oz/85 g) / 7  g  Choose: Hamburger, lean (3 oz/85 g) / 5 g  Avoid: Ham (3 oz/85 g) / 6 g  Choose: Ham, lean cut (3 oz/85 g) / 2.4 g  Avoid: Chicken, with skin, dark meat (3 oz/85 g) / 4 g  Choose: Chicken, skin removed, dark meat (3 oz/85 g) / 2 g  Avoid: Chicken, with skin, light meat (3 oz/85 g) / 2.5 g  Choose: Chicken, skin removed, light meat (3 oz/85 g) / 1 g Dairy / Saturated Fat (g)  Avoid: Whole milk (1 cup) / 5 g  Choose: Low-fat milk, 2% (1 cup) / 3 g  Choose: Low-fat milk, 1% (1 cup) / 1.5 g  Choose: Skim milk (1 cup) / 0.3 g  Avoid: Hard cheese (1 oz/28 g) / 6 g  Choose: Skim milk cheese (1 oz/28 g) / 2 to 3 g  Avoid: Cottage cheese, 4% fat (1 cup) / 6.5 g  Choose: Low-fat cottage cheese, 1% fat (1 cup) / 1.5 g  Avoid: Ice cream (1 cup) / 9 g  Choose: Sherbet (1 cup) / 2.5 g  Choose: Nonfat frozen yogurt (1 cup) / 0.3 g  Choose: Frozen fruit bar / trace  Avoid: Whipped cream (1 tbs) / 3.5 g  Choose: Nondairy whipped topping (1 tbs) / 1 g Condiments / Saturated Fat (g)  Avoid: Mayonnaise (1 tbs) / 2 g  Choose: Low-fat mayonnaise (1 tbs) / 1 g  Avoid: Butter (1 tbs) / 7 g  Choose: Extra light margarine (1 tbs) / 1 g  Avoid: Coconut oil (1 tbs) / 11.8 g  Choose: Olive oil (1 tbs) / 1.8 g  Choose: Corn oil (1 tbs) / 1.7 g  Choose: Safflower oil (1 tbs) / 1.2 g  Choose: Sunflower oil (1 tbs) / 1.4 g  Choose: Soybean oil (1 tbs) / 2.4 g  Choose: Canola oil (1 tbs) / 1 g Document Released: 01/23/2005 Document Revised: 05/20/2012 Document Reviewed: 04/23/2013 [employer] Patient Information 2015 [employer]. This information is not intended to replace advice given to you by your health care provider. Make sure you discuss any questions you have with your health care provider.  
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
## 2014-08-20 NOTE — Assessment & Plan Note (Signed)
We will try aggressive diet and exercise, as well as red yeast rice extract. Recheck in 3 months.
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
## 2014-08-20 NOTE — Assessment & Plan Note (Signed)
Unremarkable annual physical exam.

## 2014-12-21 ENCOUNTER — Encounter: Payer: Self-pay | Admitting: Osteopathic Medicine

## 2014-12-21 ENCOUNTER — Ambulatory Visit (INDEPENDENT_AMBULATORY_CARE_PROVIDER_SITE_OTHER): Payer: PRIVATE HEALTH INSURANCE | Admitting: Osteopathic Medicine

## 2014-12-21 VITALS — BP 141/72 | HR 71 | Temp 97.5°F | Wt 174.0 lb

## 2014-12-21 DIAGNOSIS — W57XXXA Bitten or stung by nonvenomous insect and other nonvenomous arthropods, initial encounter: Secondary | ICD-10-CM | POA: Diagnosis not present

## 2014-12-21 DIAGNOSIS — T148 Other injury of unspecified body region: Secondary | ICD-10-CM

## 2014-12-21 DIAGNOSIS — L509 Urticaria, unspecified: Secondary | ICD-10-CM | POA: Diagnosis not present

## 2014-12-21 MED ORDER — HYDROXYZINE HCL 50 MG PO TABS: 50.0000 mg | ORAL_TABLET | Freq: Three times a day (TID) | ORAL | Status: DC | PRN

## 2014-12-21 MED ORDER — HYDROCORTISONE 1 % EX OINT: 1.0000 "application " | TOPICAL_OINTMENT | Freq: Two times a day (BID) | CUTANEOUS | Status: DC
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
## 2015-09-13 ENCOUNTER — Ambulatory Visit: Payer: PRIVATE HEALTH INSURANCE | Admitting: Sports Medicine

## 2015-09-28 ENCOUNTER — Encounter: Payer: Self-pay | Admitting: Sports Medicine

## 2015-09-28 ENCOUNTER — Ambulatory Visit (INDEPENDENT_AMBULATORY_CARE_PROVIDER_SITE_OTHER): Payer: PRIVATE HEALTH INSURANCE

## 2015-09-28 ENCOUNTER — Ambulatory Visit (INDEPENDENT_AMBULATORY_CARE_PROVIDER_SITE_OTHER): Payer: PRIVATE HEALTH INSURANCE | Admitting: Sports Medicine

## 2015-09-28 DIAGNOSIS — S6991XA Unspecified injury of right wrist, hand and finger(s), initial encounter: Secondary | ICD-10-CM

## 2015-09-28 DIAGNOSIS — M25531 Pain in right wrist: Secondary | ICD-10-CM | POA: Diagnosis not present

## 2015-09-28 DIAGNOSIS — M720 Palmar fascial fibromatosis [Dupuytren]: Secondary | ICD-10-CM | POA: Diagnosis not present

## 2015-09-28 MED ORDER — MELOXICAM 15 MG PO TABS: ORAL_TABLET | ORAL | 3 refills | Status: DC
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
## 2015-09-28 NOTE — Assessment & Plan Note (Signed)
Asymptomatic, no further intervention needed. 

## 2015-09-28 NOTE — Assessment & Plan Note (Signed)
Suspect strain of the flexor carpi ulnaris, versus old fracture, injury happened 6 weeks ago while boxing. X-rays, continue wrist brace, adding meloxicam, return to see me in 2 weeks, if insufficient improvement we will proceed with MRI.

## 2015-09-28 NOTE — Patient Instructions (Signed)
Dupuytren's Contracture  Dupuytren's contracture affects the fingers and the palm of the hand. This condition usually develops slowly. It may take many years to develop. The pinky finger and the ring finger are most often affected. These fingers start to curve inward, like a claw. At some point, the fingers cannot go straight anymore. This can make it hard to do things like:  · Put on gloves.  · Shake hands.  · Grab something off a shelf.  The condition usually does not cause pain and is not dangerous.  The condition gets its name from the doctor who came up with an operation to fix the problem. His name was Baron Guillaume Dupuytren. Contracture means pulling inward.  CAUSES   Dupuytren's contracture does not start with the fingers. It starts in the palm of the hand, under the skin. The tissue under the skin is called fascia. The fascia covers the cords (tendons) that control how the fingers move. In Dupuytren's contracture the fascia tissue becomes thick and then pulls on the cords. That causes the fingers to curl. The condition can affect both hands and any fingers, but it usually strikes one hand worse than the other. The fingers farthest from the thumb are most often the ones that curl. The cause is not clear. Some experts believe it results from an autoimmune reaction. That means the body's immune system (which fights off disease) attacks itself by mistake. What experts do know is that certain conditions and behaviors (called risk factors) make the chance of having this condition more likely. They include:  · Age. Most people who have the condition are older than 50.  · Sex. It affects men more often than women.  · Family history. The condition tends to run in families from countries in [place].  · Certain behaviors. People who smoke and drink alcohol are more apt to develop the problem.  · Some other medical conditions. Having diabetes makes Dupuytren's contracture more likely. So does  having a condition that involves a seizure (when the brain's function is interrupted).  SYMPTOMS   Signs of this condition take time to develop. Sometimes this takes weeks or months. More often, it takes several years.   · Early symptoms:    Skin on the palm of the hand becomes thick. This is usually the first sign.    The skin may look dimpled or puckered.    Lumps (nodules) show up on the palm. There may be one or more lumps. They are not painful.  · Later symptoms:    Thick cords of tissue form in the palm of the hand.    The pinky and ring fingers start to curl up into the palm.    The fingers cannot be straightened into their normal position.  DIAGNOSIS   A physical examination is the main way that a healthcare provider can tell if you have Dupuytren's contracture. Other tests usually are not needed. The caregiver will probably:  · Look at your hands. Feel your hands. This is to check for thickening and nodules.  · Measure finger motion. This tells how much your fingers have contracted (pulled in).  · Do a tabletop test. You will be asked to try to put your hand flat on a table, palm down.  TREATMENT   There is no cure for Dupuytren's contracture. But there are ways to treat the symptoms. Options include:  · Watching and waiting. The condition develops slowly. Often it does not create problems   for a long time. Sometimes the skin gets thick and nodules form, but the fingers never curl. So, in some cases it is best to just watch the condition carefully and wait to see what happens.  · Shots (injections). Different substances may be injected, including:    Steroids. These drugs block swelling. These shots should make the condition less uncomfortable. Steroids may also slow down the condition. Shots are given into the nodules. The effect only lasts awhile. More shots may have to be given.    Enzymes. These are proteins. They weaken the thick tissue. After an injection, the caregiver usually stretches the  fingers.  · Needling. A needle is pushed through the skin and into the thick tissue. This is done in several spots. The goal is to break up the thickened tissue. Or to weaken it.  · Surgery. This may be suggested if you cannot grasp objects. Or, if you can no longer put your hand in your pocket.    A cut (incision) is made in the palm of the hand. The thick tissue is removed.    Sometimes the thick tissue is attached to the skin. Then, the skin must be removed, too. It is replaced with a piece of skin from another place on your body. That is called a skin graft.    Occupational or hand therapy is almost always needed after surgery. This involves special exercises to get back the use of your hand and fingers. After a skin graft, several months of therapy may be needed.    Sometimes the condition comes back, even after surgery.  · Other methods. You can do some things on your own. They include:    Stretching the fingers backwards. Do this often.    Warming the hand and massaging it. Again, do this often.    Using tools with padded grips. This should make things easier.    Wearing heavy gloves while working. This protects the hands.  PROGNOSIS   Dupuytren's contracture usually develops slowly. There is no cure. But, the symptoms can be treated. Sometimes they come back after treatment, but not always. It is important to remember that this is a functional problem and not a life-threatening condition.     This information is not intended to replace advice given to you by your health care provider. Make sure you discuss any questions you have with your health care provider.     Document Released: 11/20/2008 Document Revised: 02/13/2014 Document Reviewed: 11/20/2008  Elsevier Interactive Patient Education ©2016 [employer]vier Inc.

## 2016-09-29 ENCOUNTER — Other Ambulatory Visit: Payer: Self-pay | Admitting: Sports Medicine

## 2016-09-29 DIAGNOSIS — S6991XA Unspecified injury of right wrist, hand and finger(s), initial encounter: Secondary | ICD-10-CM

## 2016-11-07 ENCOUNTER — Other Ambulatory Visit: Payer: Self-pay | Admitting: Sports Medicine

## 2016-11-07 DIAGNOSIS — S6991XA Unspecified injury of right wrist, hand and finger(s), initial encounter: Secondary | ICD-10-CM

## 2016-12-13 ENCOUNTER — Other Ambulatory Visit: Payer: Self-pay | Admitting: Sports Medicine

## 2016-12-13 DIAGNOSIS — S6991XA Unspecified injury of right wrist, hand and finger(s), initial encounter: Secondary | ICD-10-CM

## 2017-06-06 ENCOUNTER — Other Ambulatory Visit: Payer: Self-pay

## 2017-06-06 DIAGNOSIS — Z130 Encounter for screening for diseases of the blood and blood-forming organs and certain disorders involving the immune mechanism: Secondary | ICD-10-CM

## 2017-06-06 DIAGNOSIS — E7800 Pure hypercholesterolemia, unspecified: Secondary | ICD-10-CM

## 2017-06-06 DIAGNOSIS — Z1329 Encounter for screening for other suspected endocrine disorder: Secondary | ICD-10-CM

## 2017-06-06 DIAGNOSIS — Z0000 Encounter for general adult medical examination without abnormal findings: Secondary | ICD-10-CM

## 2017-06-12 ENCOUNTER — Ambulatory Visit (INDEPENDENT_AMBULATORY_CARE_PROVIDER_SITE_OTHER): Payer: PRIVATE HEALTH INSURANCE | Admitting: Sports Medicine

## 2017-06-12 ENCOUNTER — Encounter: Payer: Self-pay | Admitting: Sports Medicine

## 2017-06-12 DIAGNOSIS — L723 Sebaceous cyst: Secondary | ICD-10-CM | POA: Diagnosis not present

## 2017-06-12 DIAGNOSIS — Z789 Other specified health status: Secondary | ICD-10-CM | POA: Diagnosis not present

## 2017-06-12 DIAGNOSIS — Z0000 Encounter for general adult medical examination without abnormal findings: Secondary | ICD-10-CM

## 2017-06-12 DIAGNOSIS — E785 Hyperlipidemia, unspecified: Secondary | ICD-10-CM

## 2017-06-12 DIAGNOSIS — Z2839 Other underimmunization status: Secondary | ICD-10-CM

## 2017-06-12 MED ORDER — LIDOCAINE 5 % EX OINT
1.0000 | TOPICAL_OINTMENT | Freq: Every day | CUTANEOUS | 0 refills | Status: DC
Start: 2017-06-12 — End: 2017-07-10

## 2017-06-12 NOTE — Assessment & Plan Note (Signed)
Midline. Adding topical lidocaine to use 2 hours before procedure, and he will return in a 30-minute slot for surgical excision.

## 2017-06-12 NOTE — Assessment & Plan Note (Signed)
Persistently elevated LDL, we did discuss atorvastatin 10 mg, he will think about it and let us know. I did discuss the pathophysiology of atherosclerosis.

## 2017-06-12 NOTE — Assessment & Plan Note (Signed)
Unremarkable physical. Adding MMR titers and HIV screen.
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
## 2017-06-13 LAB — MEASLES/MUMPS/RUBELLA IMMUNITY
Mumps IgG: <9 AU/mL — ABNORMAL LOW
Rubella: 1.61 index
Rubeola IgG: 231 AU/mL

## 2017-06-13 LAB — CBC WITH DIFFERENTIAL/PLATELET
Basophils Absolute: 49 cells/uL (ref 0–200)
Basophils Relative: 1.2 %
Eosinophils Absolute: 139 {cells}/uL (ref 15–500)
Eosinophils Relative: 3.4 %
HCT: 41.4 % (ref 38.5–50.0)
Hemoglobin: 15 g/dL (ref 13.2–17.1)
Lymphs Abs: 1082 cells/uL (ref 850–3900)
MCH: 34.1 pg — ABNORMAL HIGH (ref 27.0–33.0)
MCHC: 36.2 g/dL — ABNORMAL HIGH (ref 32.0–36.0)
MCV: 94.1 fL (ref 80.0–100.0)
MPV: 9.6 fL (ref 7.5–12.5)
Monocytes Relative: 8.4 %
Neutro Abs: 2485 cells/uL (ref 1500–7800)
Neutrophils Relative %: 60.6 %
Platelets: 211 Thousand/uL (ref 140–400)
RBC: 4.4 10*6/uL (ref 4.20–5.80)
RDW: 12.7 % (ref 11.0–15.0)
Total Lymphocyte: 26.4 %
WBC mixed population: 344 {cells}/uL (ref 200–950)
WBC: 4.1 10*3/uL (ref 3.8–10.8)

## 2017-06-13 LAB — LIPID PANEL W/REFLEX DIRECT LDL
Cholesterol: 240 mg/dL — ABNORMAL HIGH (ref <200)
HDL: 61 mg/dL (ref >40)
LDL Cholesterol (Calc): 157 mg/dL (calc) — ABNORMAL HIGH
Non-HDL Cholesterol (Calc): 179 mg/dL — ABNORMAL HIGH (ref <130)
Total CHOL/HDL Ratio: 3.9 (calc) (ref <5.0)
Triglycerides: 106 mg/dL (ref <150)

## 2017-06-13 LAB — COMPLETE METABOLIC PANEL WITH GFR
AG Ratio: 2.3 (calc) (ref 1.0–2.5)
ALT: 16 U/L (ref 9–46)
AST: 21 U/L (ref 10–35)
Alkaline phosphatase (APISO): 59 U/L (ref 40–115)
BUN: 18 mg/dL (ref 7–25)
Chloride: 106 mmol/L (ref 98–110)
GFR, Est African American: 92 mL/min/{1.73_m2} (ref >=60)
Glucose, Bld: 95 mg/dL (ref 65–99)
Potassium: 4.2 mmol/L (ref 3.5–5.3)
Total Bilirubin: 0.6 mg/dL (ref 0.2–1.2)
Total Protein: 6.5 g/dL (ref 6.1–8.1)

## 2017-06-13 LAB — TSH: TSH: 2.62 mIU/L (ref 0.40–4.50)

## 2017-06-13 LAB — COMPLETE METABOLIC PANEL WITHOUT GFR
Albumin: 4.5 g/dL (ref 3.6–5.1)
CO2: 28 mmol/L (ref 20–32)
Calcium: 9.4 mg/dL (ref 8.6–10.3)
Creat: 1.06 mg/dL (ref 0.70–1.33)
GFR, Est Non African American: 80 mL/min/1.73m2 (ref >=60)
Globulin: 2 g/dL (ref 1.9–3.7)
Sodium: 141 mmol/L (ref 135–146)

## 2017-06-13 LAB — HIV ANTIBODY (ROUTINE TESTING W REFLEX): HIV 1&2 Ab, 4th Generation: NONREACTIVE

## 2017-06-14 DIAGNOSIS — Z2839 Other underimmunization status: Secondary | ICD-10-CM | POA: Insufficient documentation

## 2017-06-14 DIAGNOSIS — Z789 Other specified health status: Secondary | ICD-10-CM | POA: Insufficient documentation

## 2017-06-14 NOTE — Assessment & Plan Note (Signed)
Labs show good immunity to measles and rubella, he is not immune to mumps and should probably start the vaccine series.  He can come back in a nurse visit for the MMR series.

## 2017-06-18 ENCOUNTER — Ambulatory Visit (INDEPENDENT_AMBULATORY_CARE_PROVIDER_SITE_OTHER): Payer: PRIVATE HEALTH INSURANCE | Admitting: Sports Medicine

## 2017-06-18 ENCOUNTER — Encounter: Payer: Self-pay | Admitting: Sports Medicine

## 2017-06-18 DIAGNOSIS — L723 Sebaceous cyst: Secondary | ICD-10-CM | POA: Diagnosis not present

## 2017-06-18 MED ORDER — HYDROCODONE-ACETAMINOPHEN 5-325 MG PO TABS: 1.0000 | ORAL_TABLET | Freq: Three times a day (TID) | ORAL | 0 refills | Status: DC | PRN

## 2017-06-18 NOTE — Assessment & Plan Note (Signed)
Excision, closure with Dermabond. Return to see me in 2 weeks. He does have a suspicious lesion on his left forearm, this will be removed at the follow-up visit.

## 2017-06-18 NOTE — Progress Notes (Signed)
   Procedure:  Excision of 1cm scrotal raphe sebaceous cyst Risks, benefits, and alternatives explained and consent obtained. Time out conducted. Surface prepped with alcohol. 3cc lidocaine with epinephine infiltrated in a field block. Adequate anesthesia ensured. Area prepped and draped in a sterile fashion. Excision performed with: I made a linear incision, the 15 blade and then using sharp and blunt dissection I removed the cyst en bloc, the incision was so small I was able to close it with Dermabond. Hemostasis achieved. Pt stable.

## 2017-07-10 ENCOUNTER — Ambulatory Visit (INDEPENDENT_AMBULATORY_CARE_PROVIDER_SITE_OTHER): Payer: PRIVATE HEALTH INSURANCE | Admitting: Sports Medicine

## 2017-07-10 ENCOUNTER — Encounter: Payer: Self-pay | Admitting: Sports Medicine

## 2017-07-10 DIAGNOSIS — L989 Disorder of the skin and subcutaneous tissue, unspecified: Secondary | ICD-10-CM | POA: Diagnosis not present

## 2017-07-10 DIAGNOSIS — L723 Sebaceous cyst: Secondary | ICD-10-CM
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
## 2017-07-10 NOTE — Assessment & Plan Note (Signed)
Shave biopsy as above 

## 2017-07-10 NOTE — Assessment & Plan Note (Signed)
Doing extremely well, healed, no further intervention needed.

## 2017-09-23 IMAGING — DX DG WRIST COMPLETE 3+V*R*
4 series · 4 of 4 positions shown · non-contrast
Comparison: None in PACs

CLINICAL DATA: Medial right wrist pain over the ulnar styloid
without known injury. There is pain to palpation over this region
along putting pressure on the right wrist oral rotating the wrists.

EXAM:
RIGHT WRIST - COMPLETE 3+ VIEW

[wrist pa]
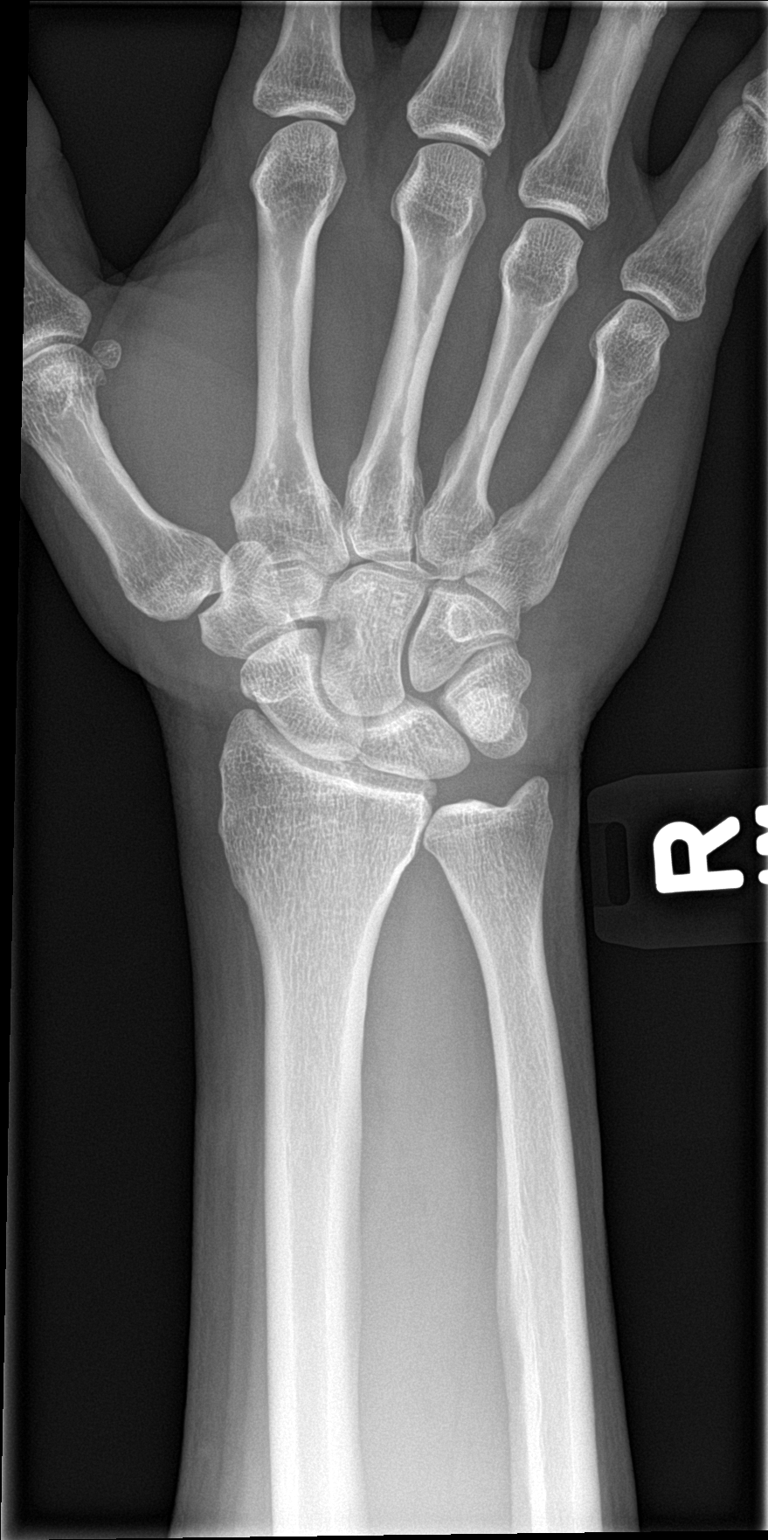

[wrist obl]
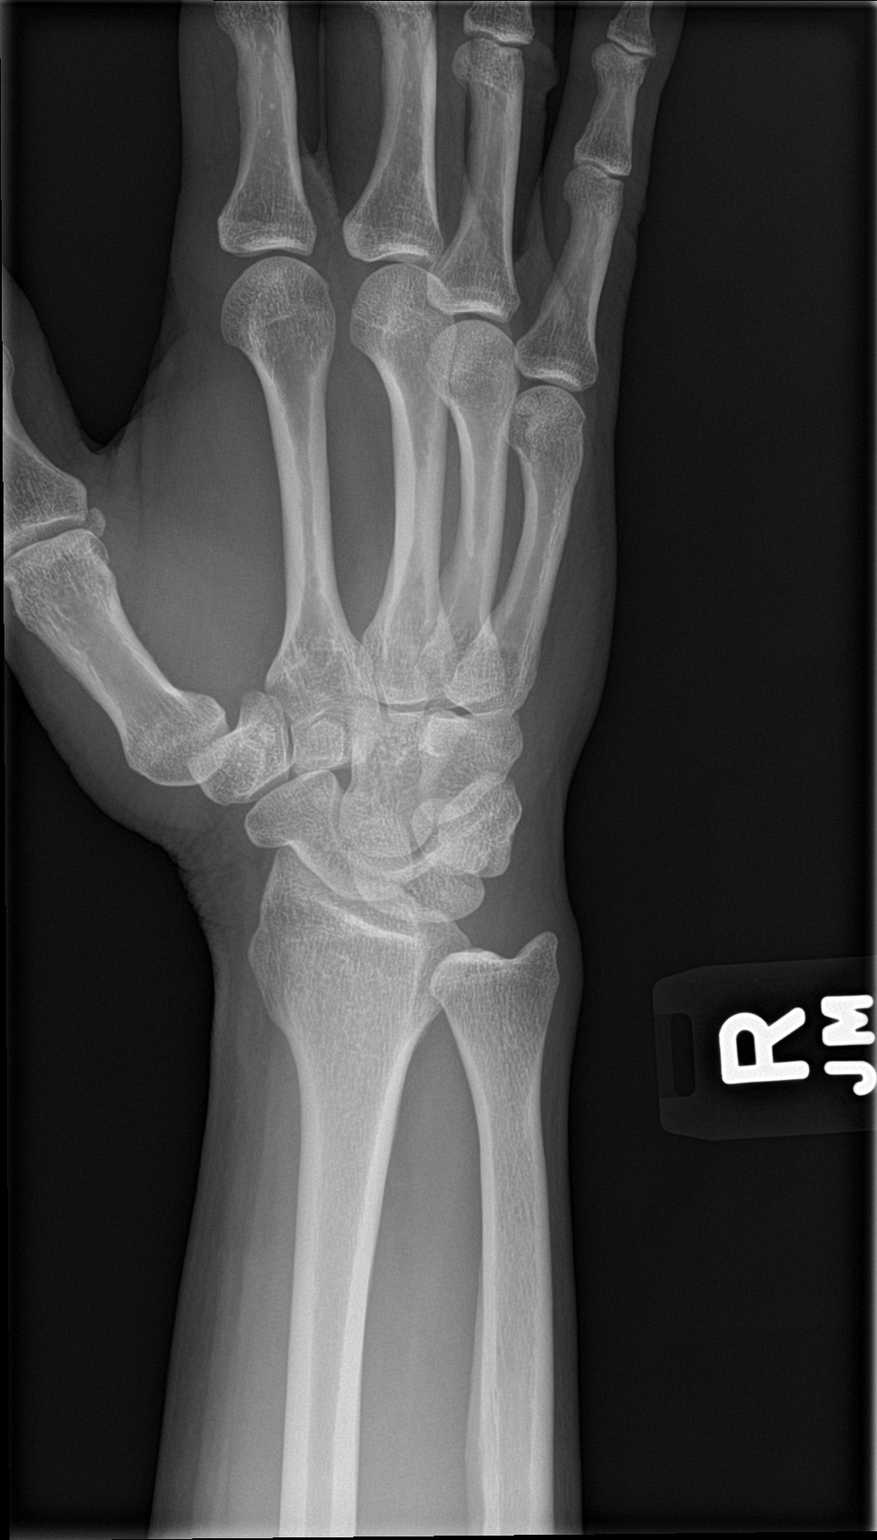

[wrist lat]
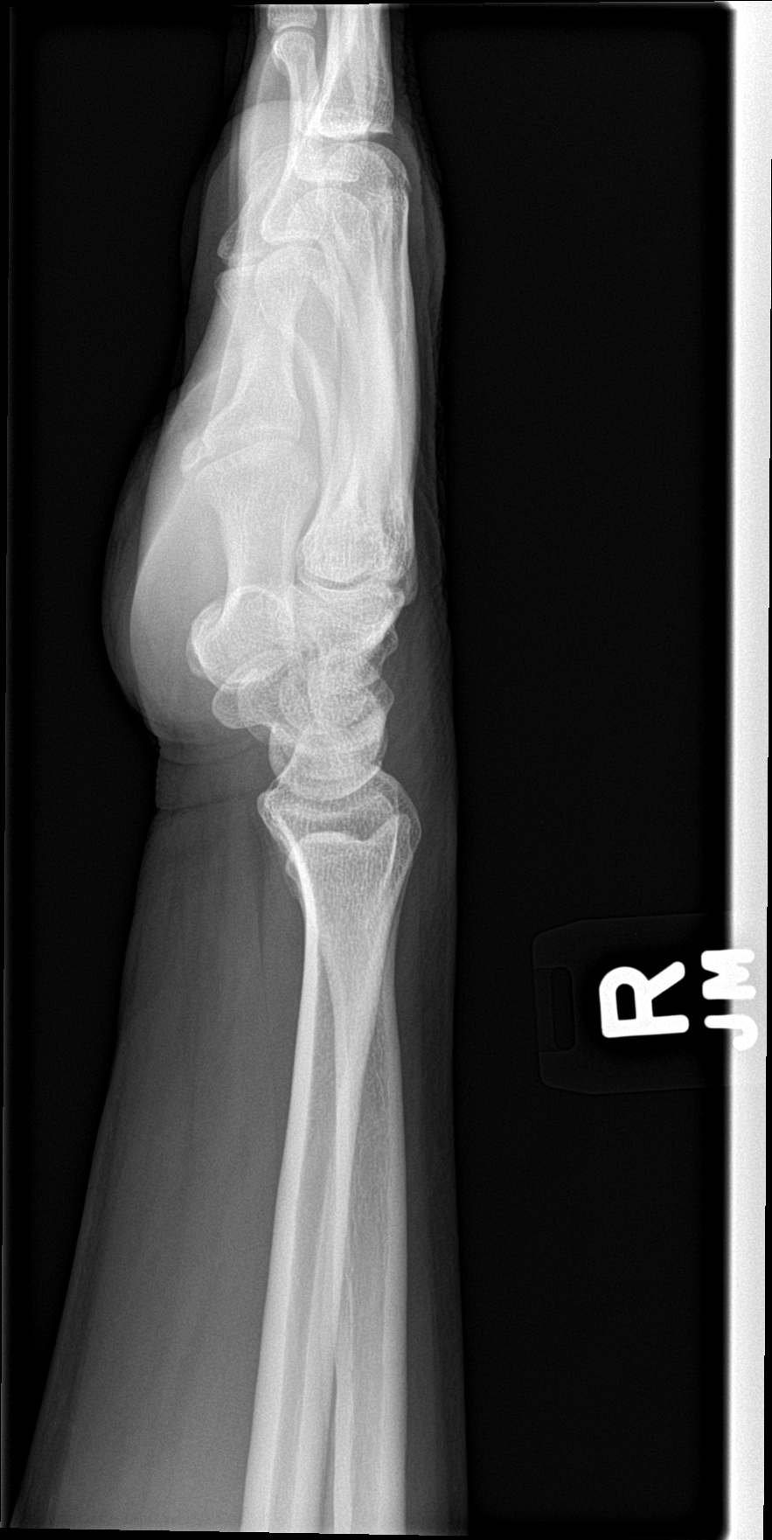

[wrist navicular]
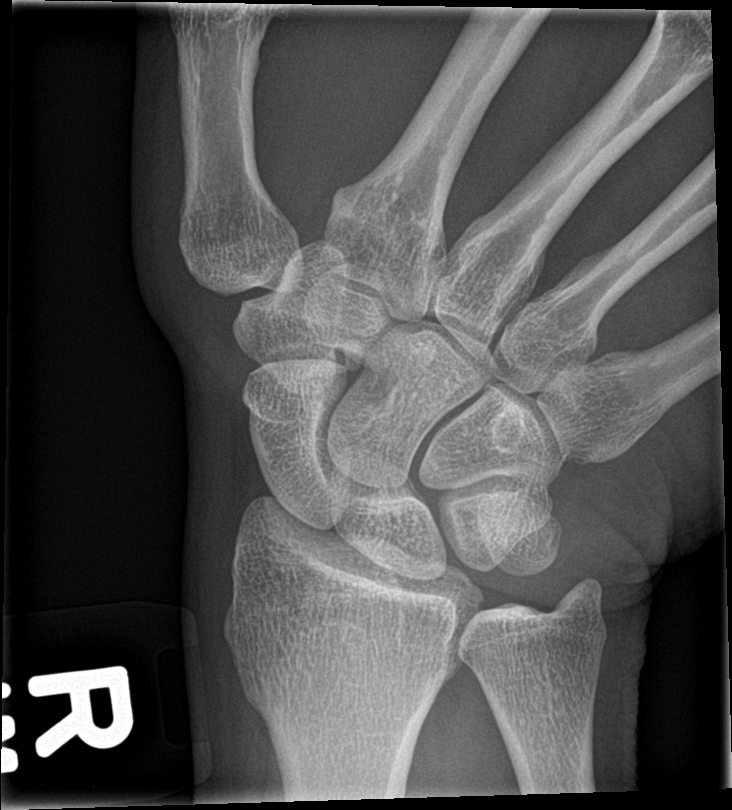

[4 of 4 positions shown; findings below may reference images not displayed]

FINDINGS: The bones are subjectively adequately mineralized. There is no acute
or healing fracture. The joint spaces are preserved. No abnormal
soft tissue calcifications are demonstrated. No significant soft
tissue swelling is observed.
IMPRESSION: No acute or chronic bony abnormality is demonstrated. Specific
attention to the ulnar styloid reveals no bony or surrounding soft
tissue abnormality.

## 2018-04-25 ENCOUNTER — Telehealth: Payer: PRIVATE HEALTH INSURANCE | Admitting: Family

## 2018-04-25 DIAGNOSIS — R0602 Shortness of breath: Secondary | ICD-10-CM

## 2018-04-25 MED ORDER — BENZONATATE 200 MG PO CAPS: 200.0000 mg | ORAL_CAPSULE | Freq: Three times a day (TID) | ORAL | 0 refills | Status: DC | PRN

## 2018-04-25 MED ORDER — ALBUTEROL SULFATE HFA 108 (90 BASE) MCG/ACT IN AERS: 2.0000 | INHALATION_SPRAY | Freq: Four times a day (QID) | RESPIRATORY_TRACT | 0 refills | Status: DC | PRN

## 2018-04-25 NOTE — Progress Notes (Signed)
Greater than 5 minutes, yet less than 10 minutes of time have been spent researching, coordinating, and implementing care for this patient today.  Thank you for the details you included in the comment boxes. Those details are very helpful in determining the best course of treatment for you and help [place] to provide the best care.  The government is very strict on the criteria for testing right now. If a patient goes to a testing site or drive-through site, they will be turned away if they do not meet the criteria. There are 3 levels: low risk, high risk, and emergency. You are considered a low-risk patient at this time due to lack of exposure to someone who tested positive and lack of severe shortness of breath. That said, we are not yet permitted to order rule-out testing on everyone unless high risk or emergency cases.   E-Visit for Corona Virus Screening Based on your current symptoms, it seems unlikely that your symptoms are related to the [place] virus.   Coronavirus disease 2019 (COVID-19) is a respiratory illness that can spread from person to person. The virus that causes COVID-19 is a new virus that was first identified in the country of [place] but is now found in multiple other countries and has spread to the [place].  Symptoms associated with the virus are mild to severe fever, cough, and shortness of breath. There is currently no vaccine to protect against COVID-19, and there is no specific antiviral treatment for the virus.  It is vitally important that if you feel that you have an infection such as this virus or any other virus that you stay home and away from places where you may spread it to others.  Currently, not all patients are being tested. If the symptoms are mild and there is not a known exposure, performing the test is not indicated.  You can use medication such as A prescription cough medication called Tessalon Perles 100 mg. You may take 1-2 capsules every 8 hours as needed  for cough and A prescription inhaler called Albuterol MDI 90 mcg /actuation 2 puffs every 4 hours as needed for shortness of breath, wheezing, cough  Reduce your risk of any infection by using the same precautions used for avoiding the common cold or flu:  Marland Kitchen Wash your hands often with soap and warm water for at least 20 seconds.  If soap and water are not readily available, use an alcohol-based hand sanitizer with at least 60% alcohol.  . If coughing or sneezing, cover your mouth and nose by coughing or sneezing into the elbow areas of your shirt or coat, into a tissue or into your sleeve (not your hands). . Avoid shaking hands with others and consider head nods or verbal greetings only. . Avoid touching your eyes, nose, or mouth with unwashed hands.  . Avoid close contact with people who are sick. . Avoid places or events with large numbers of people in one location, like concerts or sporting events. . Carefully consider travel plans you have or are making. . If you are planning any travel outside or inside the [place], visit the CDC's Travelers' Health webpage for the latest health notices. . If you have some symptoms but not all symptoms, continue to monitor at home and seek medical attention if your symptoms worsen. . If you are having a medical emergency, call 911.  HOME CARE . Only take medications as instructed by your medical team. . Drink plenty of fluids and get  plenty of rest. . A steam or ultrasonic humidifier can help if you have congestion.   GET HELP RIGHT AWAY IF: . You develop worsening fever. . You become short of breath . You cough up blood. . Your symptoms become more severe MAKE SURE YOU   Understand these instructions.  Will watch your condition.  Will get help right away if you are not doing well or get worse.  Your e-visit answers were reviewed by a board certified advanced clinical practitioner to complete your personal care plan.  Depending on the condition, your  plan could have included both over the counter or prescription medications.  If there is a problem please reply once you have received a response from your provider. Your safety is important to [place].  If you have drug allergies check your prescription carefully.    You can use MyChart to ask questions about today's visit, request a non-urgent call back, or ask for a work or school excuse for 24 hours related to this e-Visit. If it has been greater than 24 hours you will need to follow up with your provider, or enter a new e-Visit to address those concerns. You will get an e-mail in the next two days asking about your experience.  I hope that your e-visit has been valuable and will speed your recovery. Thank you for using e-visits.

## 2018-05-04 ENCOUNTER — Telehealth: Payer: No Typology Code available for payment source | Admitting: Nurse Practitioner

## 2018-05-04 DIAGNOSIS — J40 Bronchitis, not specified as acute or chronic: Secondary | ICD-10-CM

## 2018-05-04 MED ORDER — PREDNISONE 10 MG (21) PO TBPK: ORAL_TABLET | ORAL | 0 refills | Status: DC

## 2018-05-04 MED ORDER — BENZONATATE 100 MG PO CAPS: 100.0000 mg | ORAL_CAPSULE | Freq: Three times a day (TID) | ORAL | 0 refills | Status: DC | PRN

## 2018-05-04 MED ORDER — DOXYCYCLINE HYCLATE 100 MG PO TABS: 100.0000 mg | ORAL_TABLET | Freq: Two times a day (BID) | ORAL | 0 refills | Status: DC

## 2018-05-04 NOTE — Progress Notes (Signed)
We are sorry that you are not feeling well.  Here is how we plan to help!  Based on your presentation I believe you most likely have A cough due to bacteria.  When patients have a fever and a productive cough with a change in color or increased sputum production, we are concerned about bacterial bronchitis.  If left untreated it can progress to pneumonia.  If your symptoms do not improve with your treatment plan it is important that you contact your provider.   I have prescribed Doxycycline 100 mg twice a day for 7 days     In addition you may use A prescription cough medication called Tessalon Perles 100mg . You may take 1-2 capsules every 8 hours as needed for your cough.  Prednisone 10 mg daily for 6 days (see taper instructions below)  Directions for 6 day taper: Day 1: 2 tablets before breakfast, 1 after both lunch & dinner and 2 at bedtime Day 2: 1 tab before breakfast, 1 after both lunch & dinner and 2 at bedtime Day 3: 1 tab at each meal & 1 at bedtime Day 4: 1 tab at breakfast, 1 at lunch, 1 at bedtime Day 5: 1 tab at breakfast & 1 tab at bedtime Day 6: 1 tab at breakfast   From your responses in the eVisit questionnaire you describe inflammation in the upper respiratory tract which is causing a significant cough.  This is commonly called Bronchitis and has four common causes:    Allergies  Viral Infections  Acid Reflux  Bacterial Infection Allergies, viruses and acid reflux are treated by controlling symptoms or eliminating the cause. An example might be a cough caused by taking certain blood pressure medications. You stop the cough by changing the medication. Another example might be a cough caused by acid reflux. Controlling the reflux helps control the cough.  USE OF BRONCHODILATOR ("RESCUE") INHALERS: There is a risk from using your bronchodilator too frequently.  The risk is that over-reliance on a medication which only relaxes the muscles surrounding the breathing tubes  can reduce the effectiveness of medications prescribed to reduce swelling and congestion of the tubes themselves.  Although you feel brief relief from the bronchodilator inhaler, your asthma may actually be worsening with the tubes becoming more swollen and filled with mucus.  This can delay other crucial treatments, such as oral steroid medications. If you need to use a bronchodilator inhaler daily, several times per day, you should discuss this with your provider.  There are probably better treatments that could be used to keep your asthma under control.     HOME CARE . Only take medications as instructed by your medical team. . Complete the entire course of an antibiotic. . Drink plenty of fluids and get plenty of rest. . Avoid close contacts especially the very young and the elderly . Cover your mouth if you cough or cough into your sleeve. . Always remember to wash your hands . A steam or ultrasonic humidifier can help congestion.   GET HELP RIGHT AWAY IF: . You develop worsening fever. . You become short of breath . You cough up blood. . Your symptoms persist after you have completed your treatment plan MAKE SURE YOU   Understand these instructions.  Will watch your condition.  Will get help right away if you are not doing well or get worse.  Your e-visit answers were reviewed by a board certified advanced clinical practitioner to complete your personal care plan.  Depending  on the condition, your plan could have included both over the counter or prescription medications. If there is a problem please reply  once you have received a response from your provider. Your safety is important to [place].  If you have drug allergies check your prescription carefully.    You can use MyChart to ask questions about today's visit, request a non-urgent call back, or ask for a work or school excuse for 24 hours related to this e-Visit. If it has been greater than 24 hours you will need to follow up  with your provider, or enter a new e-Visit to address those concerns. You will get an e-mail in the next two days asking about your experience.  I hope that your e-visit has been valuable and will speed your recovery. Thank you for using e-visits.  5 minutes spent reviewing and documenting in chart.

## 2018-05-04 NOTE — Addendum Note (Signed)
Addended by: Jannifer Rodney A on: 05/04/2018 07:10 PM   Modules accepted: Orders

## 2018-05-06 ENCOUNTER — Other Ambulatory Visit: Payer: Self-pay

## 2018-05-06 ENCOUNTER — Emergency Department (HOSPITAL_COMMUNITY): Payer: No Typology Code available for payment source

## 2018-05-06 ENCOUNTER — Telehealth: Payer: Self-pay

## 2018-05-06 ENCOUNTER — Encounter: Payer: Self-pay | Admitting: Sports Medicine

## 2018-05-06 ENCOUNTER — Emergency Department (HOSPITAL_COMMUNITY)
Admission: EM | Admit: 2018-05-06 | Discharge: 2018-05-06 | Disposition: A | Discharge status: Home / Self Care | Payer: No Typology Code available for payment source | Attending: Emergency Medicine | Admitting: Emergency Medicine

## 2018-05-06 ENCOUNTER — Ambulatory Visit (INDEPENDENT_AMBULATORY_CARE_PROVIDER_SITE_OTHER): Payer: PRIVATE HEALTH INSURANCE | Admitting: Sports Medicine

## 2018-05-06 ENCOUNTER — Encounter (HOSPITAL_COMMUNITY): Payer: Self-pay | Admitting: Emergency Medicine

## 2018-05-06 DIAGNOSIS — R69 Illness, unspecified: Principal | ICD-10-CM

## 2018-05-06 DIAGNOSIS — R05 Cough: Secondary | ICD-10-CM | POA: Diagnosis present

## 2018-05-06 DIAGNOSIS — J111 Influenza due to unidentified influenza virus with other respiratory manifestations: Secondary | ICD-10-CM

## 2018-05-06 MED ORDER — HYDROCOD POLST-CPM POLST ER 10-8 MG/5ML PO SUER: 5.0000 mL | Freq: Two times a day (BID) | ORAL | 0 refills | Status: DC | PRN

## 2018-05-06 MED ORDER — ACETAMINOPHEN 500 MG PO TABS
1000.0000 mg | ORAL_TABLET | Freq: Once | ORAL | Status: AC
  Administered 2018-05-06: 1000 mg via ORAL
  Filled 2018-05-06: qty 2

## 2018-05-06 MED ORDER — IBUPROFEN 800 MG PO TABS
800.0000 mg | ORAL_TABLET | Freq: Once | ORAL | Status: AC
  Administered 2018-05-06: 800 mg via ORAL
  Filled 2018-05-06: qty 1

## 2018-05-06 NOTE — Telephone Encounter (Signed)
Arnol's wife called and would like Dr Benjamin Stain to order a chest xray to [facility] on [address]. She works there and they are doing visits. She states she hears no sounds in his right lung. Please advise.
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
## 2018-05-06 NOTE — Telephone Encounter (Signed)
Orders placed, as long as they know that he is potentially still infectious.

## 2018-05-06 NOTE — ED Triage Notes (Signed)
Pt has had low grade fever for 2 weeks, dry cough and fatigue. Pt went to [place] 2 weeks ago, denies exposure that he knows of. Pt states if he takes a deep breath he coughs. No sickness at home.

## 2018-05-06 NOTE — Discharge Instructions (Addendum)
Take tylenol 2 pills 4 times a day and motrin 4 pills 3 times a day.  Drink plenty of fluids.  Return for worsening shortness of breath, headache, confusion. Follow up with your family doctor.      Person Under Monitoring Name: Preston Lambert  Location: [address] [place] [address]   Infection Prevention Recommendations for Individuals Confirmed to have, or Being Evaluated for, 2019 Novel Coronavirus (COVID-19) Infection Who Receive Care at Home  Individuals who are confirmed to have, or are being evaluated for, COVID-19 should follow the prevention steps below until a healthcare provider or local or state health department says they can return to normal activities.  Stay home except to get medical care You should restrict activities outside your home, except for getting medical care. Do not go to work, school, or public areas, and do not use public transportation or taxis.  Call ahead before visiting your doctor Before your medical appointment, call the healthcare provider and tell them that you have, or are being evaluated for, COVID-19 infection. This will help the healthcare providers office take steps to keep other people from getting infected. Ask your healthcare provider to call the local or state health department.  Monitor your symptoms Seek prompt medical attention if your illness is worsening (e.g., difficulty breathing). Before going to your medical appointment, call the healthcare provider and tell them that you have, or are being evaluated for, COVID-19 infection. Ask your healthcare provider to call the local or state health department.  Wear a facemask You should wear a facemask that covers your nose and mouth when you are in the same room with other people and when you visit a healthcare provider. People who live with or visit you should also wear a facemask while they are in the same room with you.  Separate yourself from other people in your home As  much as possible, you should stay in a different room from other people in your home. Also, you should use a separate bathroom, if available.  Avoid sharing household items You should not share dishes, drinking glasses, cups, eating utensils, towels, bedding, or other items with other people in your home. After using these items, you should wash them thoroughly with soap and water.  Cover your coughs and sneezes Cover your mouth and nose with a tissue when you cough or sneeze, or you can cough or sneeze into your sleeve. Throw used tissues in a lined trash can, and immediately wash your hands with soap and water for at least 20 seconds or use an alcohol-based hand rub.  Wash your [employer] your hands often and thoroughly with soap and water for at least 20 seconds. You can use an alcohol-based hand sanitizer if soap and water are not available and if your hands are not visibly dirty. Avoid touching your eyes, nose, and mouth with unwashed hands.   Prevention Steps for Caregivers and Household Members of Individuals Confirmed to have, or Being Evaluated for, COVID-19 Infection Being Cared for in the Home  If you live with, or provide care at home for, a person confirmed to have, or being evaluated for, COVID-19 infection please follow these guidelines to prevent infection:  Follow healthcare providers instructions Make sure that you understand and can help the patient follow any healthcare provider instructions for all care.  Provide for the patients basic needs You should help the patient with basic needs in the home and provide support for getting groceries, prescriptions, and  other personal needs.  Monitor the patients symptoms If they are getting sicker, call his or her medical provider and tell them that the patient has, or is being evaluated for, COVID-19 infection. This will help the healthcare providers office take steps to keep other people from getting infected. Ask  the healthcare provider to call the local or state health department.  Limit the number of people who have contact with the patient If possible, have only one caregiver for the patient. Other household members should stay in another home or place of residence. If this is not possible, they should stay in another room, or be separated from the patient as much as possible. Use a separate bathroom, if available. Restrict visitors who do not have an essential need to be in the home.  Keep older adults, very young children, and other sick people away from the patient Keep older adults, very young children, and those who have compromised immune systems or chronic health conditions away from the patient. This includes people with chronic heart, lung, or kidney conditions, diabetes, and cancer.  Ensure good ventilation Make sure that shared spaces in the home have good air flow, such as from an air conditioner or an opened window, weather permitting.  Wash your hands often Wash your hands often and thoroughly with soap and water for at least 20 seconds. You can use an alcohol based hand sanitizer if soap and water are not available and if your hands are not visibly dirty. Avoid touching your eyes, nose, and mouth with unwashed hands. Use disposable paper towels to dry your hands. If not available, use dedicated cloth towels and replace them when they become wet.  Wear a facemask and gloves Wear a disposable facemask at all times in the room and gloves when you touch or have contact with the patients blood, body fluids, and/or secretions or excretions, such as sweat, saliva, sputum, nasal mucus, vomit, urine, or feces.  Ensure the mask fits over your nose and mouth tightly, and do not touch it during use. Throw out disposable facemasks and gloves after using them. Do not reuse. Wash your hands immediately after removing your facemask and gloves. If your personal clothing becomes contaminated,  carefully remove clothing and launder. Wash your hands after handling contaminated clothing. Place all used disposable facemasks, gloves, and other waste in a lined container before disposing them with other household waste. Remove gloves and wash your hands immediately after handling these items.  Do not share dishes, glasses, or other household items with the patient Avoid sharing household items. You should not share dishes, drinking glasses, cups, eating utensils, towels, bedding, or other items with a patient who is confirmed to have, or being evaluated for, COVID-19 infection. After the person uses these items, you should wash them thoroughly with soap and water.  Wash laundry thoroughly Immediately remove and wash clothes or bedding that have blood, body fluids, and/or secretions or excretions, such as sweat, saliva, sputum, nasal mucus, vomit, urine, or feces, on them. Wear gloves when handling laundry from the patient. Read and follow directions on labels of laundry or clothing items and detergent. In general, wash and dry with the warmest temperatures recommended on the label.  Clean all areas the individual has used often Clean all touchable surfaces, such as counters, tabletops, doorknobs, bathroom fixtures, toilets, phones, keyboards, tablets, and bedside tables, every day. Also, clean any surfaces that may have blood, body fluids, and/or secretions or excretions on them. Wear gloves when cleaning  surfaces the patient has come in contact with. Use a diluted bleach solution (e.g., dilute bleach with 1 part bleach and 10 parts water) or a household disinfectant with a label that says EPA-registered for coronaviruses. To make a bleach solution at home, add 1 tablespoon of bleach to 1 quart (4 cups) of water. For a larger supply, add  cup of bleach to 1 gallon (16 cups) of water. Read labels of cleaning products and follow recommendations provided on product labels. Labels contain  instructions for safe and effective use of the cleaning product including precautions you should take when applying the product, such as wearing gloves or eye protection and making sure you have good ventilation during use of the product. Remove gloves and wash hands immediately after cleaning.  Monitor yourself for signs and symptoms of illness Caregivers and household members are considered close contacts, should monitor their health, and will be asked to limit movement outside of the home to the extent possible. Follow the monitoring steps for close contacts listed on the symptom monitoring form.   ? If you have additional questions, contact your local health department or call the epidemiologist on call at [phone] (available 24/7). ? This guidance is subject to change. For the most up-to-date guidance from [facility], please refer to their website: YouBlogs.pl

## 2018-05-06 NOTE — Assessment & Plan Note (Signed)
Influenza, COVID-19, informed patient they all look pretty much the same at this point. Principal symptom is cough, I do think he is clinically stable, continue Tessalon Perles during the day, adding Tussionex at night. Ultimately we need a chest x-ray, and he does need somebody to lay a stethoscope on his chest, but due to our protocol I am unable to see him. If he is not feeling better by tomorrow or worsens at all he will need to go to the emergency department.

## 2018-05-06 NOTE — Telephone Encounter (Signed)
Patient advised.

## 2018-05-07 ENCOUNTER — Encounter: Payer: Self-pay | Admitting: Sports Medicine

## 2018-08-21 ENCOUNTER — Encounter: Payer: Self-pay | Admitting: Sports Medicine

## 2019-07-20 ENCOUNTER — Emergency Department (INDEPENDENT_AMBULATORY_CARE_PROVIDER_SITE_OTHER)
Admission: EM | Admit: 2019-07-20 | Discharge: 2019-07-20 | Disposition: A | Discharge status: Home / Self Care | Payer: 59 | Source: Home / Self Care

## 2019-07-20 ENCOUNTER — Other Ambulatory Visit: Payer: Self-pay

## 2019-07-20 ENCOUNTER — Encounter: Payer: Self-pay | Admitting: Emergency Medicine

## 2019-07-20 DIAGNOSIS — R52 Pain, unspecified: Secondary | ICD-10-CM | POA: Diagnosis not present

## 2019-07-20 DIAGNOSIS — R509 Fever, unspecified: Secondary | ICD-10-CM | POA: Diagnosis not present

## 2019-07-20 DIAGNOSIS — R519 Headache, unspecified: Secondary | ICD-10-CM | POA: Diagnosis not present

## 2019-07-20 LAB — POCT INFLUENZA A/B
Influenza A, POC: NEGATIVE
Influenza B, POC: NEGATIVE

## 2019-07-20 LAB — POC SARS CORONAVIRUS 2 AG -  ED: SARS Coronavirus 2 Ag: NEGATIVE

## 2019-07-20 MED ORDER — IBUPROFEN 800 MG PO TABS
800.0000 mg | ORAL_TABLET | Freq: Once | ORAL | Status: AC
  Administered 2019-07-20: 800 mg via ORAL

## 2019-07-20 MED ORDER — DOXYCYCLINE HYCLATE 100 MG PO CAPS: 100.0000 mg | ORAL_CAPSULE | Freq: Two times a day (BID) | ORAL | 0 refills | Status: DC

## 2019-07-20 NOTE — Discharge Instructions (Signed)
  You may take 500mg  acetaminophen every 4-6 hours or in combination with ibuprofen 400-600mg  every 6-8 hours as needed for pain, inflammation, and fever.  Be sure to well hydrated with clear liquids and get at least 8 hours of sleep at night, preferably more while sick.   Please follow up with family medicine in 1 week if needed.  You will only be notified of abnormal labs. You can view all results in your free [place] MyChart account.  Results should be updated in about 4-5 days, sometimes sooner.
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
## 2019-07-20 NOTE — ED Triage Notes (Signed)
Patient c/o fever since Friday, sinus pressure, no cough, no runny nose, bilateral ear pressure, above eye pain.

## 2019-07-21 LAB — ROCKY MTN SPOTTED FVR ABS PNL(IGG+IGM)
RMSF IgG: NOT DETECTED
RMSF IgM: NOT DETECTED

## 2019-07-21 LAB — B. BURGDORFI ANTIBODIES: B burgdorferi Ab IgG+IgM: <0.9 index

## 2019-07-23 NOTE — Telephone Encounter (Signed)
This is too much, we need at least a virtual appointment

## 2019-07-25 ENCOUNTER — Telehealth: Payer: Self-pay | Admitting: *Deleted

## 2019-07-25 NOTE — Telephone Encounter (Signed)
Advised patient per Dr. Karie Schwalbe. No additional abx needed unless he was continuing to have symptoms. Mr. Preston Lambert said that he is not having additional problems.

## 2019-07-25 NOTE — Telephone Encounter (Signed)
No need, his Lyme testing was negative, if he is still having any symptoms they are due to something else.

## 2019-07-25 NOTE — Telephone Encounter (Signed)
Pt left vm this morning asking if you would send in another 7 days of abx.  He was seen in UC this past Sunday for possible Lyme disease.  There is a Clinical cytogeneticist message regarding this as well.

## 2020-02-19 ENCOUNTER — Encounter: Payer: 59 | Admitting: Sports Medicine

## 2020-05-01 IMAGING — DX PORTABLE CHEST - 1 VIEW
1 series · 1 of 1 positions shown · non-contrast
Comparison: None.

CLINICAL DATA: Cough and fever for the past 3 weeks.

EXAM:
PORTABLE CHEST 1 VIEW

[chest ap]
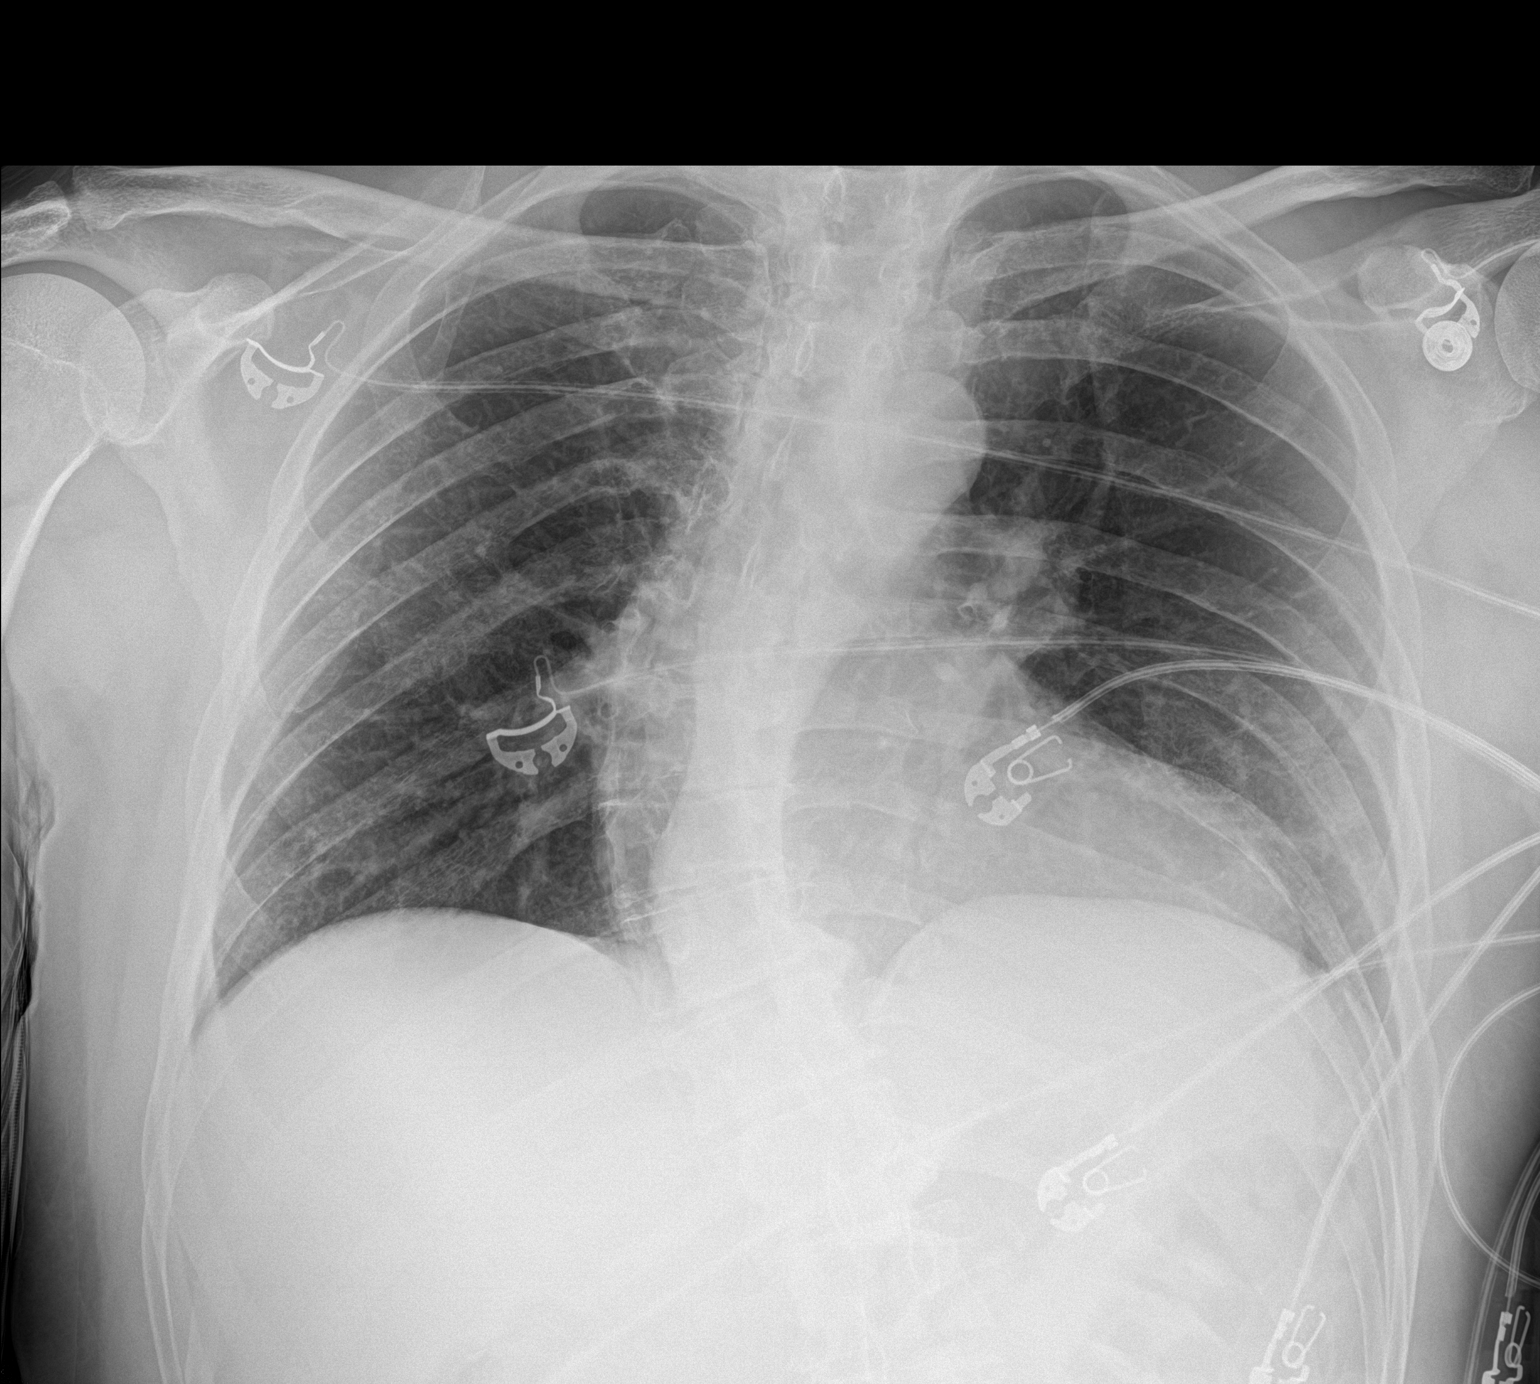

[1 of 1 positions shown; findings below may reference images not displayed]

FINDINGS: The heart size and mediastinal contours are within normal limits.
Normal pulmonary vascularity. No focal consolidation, pleural
effusion, or pneumothorax. No acute osseous abnormality.
Thoracolumbar scoliosis.
IMPRESSION: No active disease.

## 2020-09-22 ENCOUNTER — Other Ambulatory Visit: Payer: Self-pay | Admitting: Sports Medicine

## 2020-09-22 DIAGNOSIS — N139 Obstructive and reflux uropathy, unspecified: Secondary | ICD-10-CM

## 2020-09-22 DIAGNOSIS — E785 Hyperlipidemia, unspecified: Secondary | ICD-10-CM

## 2020-09-29 ENCOUNTER — Ambulatory Visit (INDEPENDENT_AMBULATORY_CARE_PROVIDER_SITE_OTHER): Payer: 59 | Admitting: Sports Medicine

## 2020-09-29 ENCOUNTER — Encounter: Payer: Self-pay | Admitting: Sports Medicine

## 2020-09-29 ENCOUNTER — Other Ambulatory Visit: Payer: Self-pay

## 2020-09-29 VITALS — BP 132/75 | HR 75 | Ht 69.0 in | Wt 182.0 lb

## 2020-09-29 DIAGNOSIS — K219 Gastro-esophageal reflux disease without esophagitis: Secondary | ICD-10-CM

## 2020-09-29 DIAGNOSIS — Z0000 Encounter for general adult medical examination without abnormal findings: Secondary | ICD-10-CM

## 2020-09-29 DIAGNOSIS — N139 Obstructive and reflux uropathy, unspecified: Secondary | ICD-10-CM

## 2020-09-29 DIAGNOSIS — N529 Male erectile dysfunction, unspecified: Secondary | ICD-10-CM

## 2020-09-29 DIAGNOSIS — E785 Hyperlipidemia, unspecified: Secondary | ICD-10-CM | POA: Diagnosis not present

## 2020-09-29 DIAGNOSIS — M940 Chondrocostal junction syndrome [Tietze]: Secondary | ICD-10-CM

## 2020-09-29 LAB — COMPREHENSIVE METABOLIC PANEL
AG Ratio: 2.6 (calc) — ABNORMAL HIGH (ref 1.0–2.5)
ALT: 21 U/L (ref 9–46)
AST: 26 U/L (ref 10–35)
Albumin: 4.4 g/dL (ref 3.6–5.1)
Alkaline phosphatase (APISO): 59 U/L (ref 35–144)
BUN: 20 mg/dL (ref 7–25)
CO2: 27 mmol/L (ref 20–32)
Calcium: 9.1 mg/dL (ref 8.6–10.3)
Chloride: 107 mmol/L (ref 98–110)
Creat: 0.97 mg/dL (ref 0.70–1.30)
Globulin: 1.7 g/dL (calc) — ABNORMAL LOW (ref 1.9–3.7)
Glucose, Bld: 97 mg/dL (ref 65–99)
Potassium: 3.8 mmol/L (ref 3.5–5.3)
Sodium: 142 mmol/L (ref 135–146)
Total Bilirubin: 0.7 mg/dL (ref 0.2–1.2)
Total Protein: 6.1 g/dL (ref 6.1–8.1)

## 2020-09-29 LAB — TSH: TSH: 2.53 mIU/L (ref 0.40–4.50)

## 2020-09-29 LAB — CBC
HCT: 41.5 % (ref 38.5–50.0)
Hemoglobin: 14.9 g/dL (ref 13.2–17.1)
MCH: 34.2 pg — ABNORMAL HIGH (ref 27.0–33.0)
MCHC: 35.9 g/dL (ref 32.0–36.0)
MCV: 95.2 fL (ref 80.0–100.0)
MPV: 9.6 fL (ref 7.5–12.5)
Platelets: 197 10*3/uL (ref 140–400)
RBC: 4.36 10*6/uL (ref 4.20–5.80)
RDW: 12.4 % (ref 11.0–15.0)
WBC: 5.3 10*3/uL (ref 3.8–10.8)

## 2020-09-29 LAB — PSA, TOTAL AND FREE
PSA, % Free: 27 % (calc) (ref >25)
PSA, Free: 0.4 ng/mL
PSA, Total: 1.5 ng/mL (ref <=4.0)

## 2020-09-29 LAB — LIPID PANEL
Cholesterol: 248 mg/dL — ABNORMAL HIGH (ref <200)
HDL: 57 mg/dL (ref >=40)
LDL Cholesterol (Calc): 164 mg/dL (calc) — ABNORMAL HIGH
Non-HDL Cholesterol (Calc): 191 mg/dL (calc) — ABNORMAL HIGH (ref <130)
Total CHOL/HDL Ratio: 4.4 (calc) (ref <5.0)
Triglycerides: 133 mg/dL (ref <150)

## 2020-09-29 MED ORDER — TADALAFIL 5 MG PO TABS: 5.0000 mg | ORAL_TABLET | Freq: Every day | ORAL | 11 refills | Status: DC

## 2020-09-29 MED ORDER — DICLOFENAC SODIUM 75 MG PO TBEC: 75.0000 mg | DELAYED_RELEASE_TABLET | Freq: Two times a day (BID) | ORAL | 0 refills | Status: DC

## 2020-09-29 MED ORDER — PANTOPRAZOLE SODIUM 40 MG PO TBEC: 40.0000 mg | DELAYED_RELEASE_TABLET | Freq: Every day | ORAL | 3 refills | Status: AC

## 2020-09-29 MED ORDER — ROSUVASTATIN CALCIUM 5 MG PO TABS: 5.0000 mg | ORAL_TABLET | Freq: Every day | ORAL | 3 refills | Status: DC

## 2020-09-29 NOTE — Assessment & Plan Note (Signed)
Very mild, symptoms noncardiac. Adding diclofenac for 2 weeks.

## 2020-09-29 NOTE — Assessment & Plan Note (Signed)
Lipid has been elevated on multiple occasions now, he is finally available to try lipid medication. Starting rosuvastatin 5 nightly, recheck in 2 months.
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
## 2020-09-29 NOTE — Assessment & Plan Note (Signed)
Routine physical as above. 

## 2020-09-29 NOTE — Assessment & Plan Note (Signed)
Pantoprazole for 6 weeks.

## 2020-09-29 NOTE — Assessment & Plan Note (Signed)
Mild erectile dysfunction, some nocturia, adding Cialis 5 daily. 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

## 2020-10-27 ENCOUNTER — Other Ambulatory Visit: Payer: Self-pay

## 2020-10-27 ENCOUNTER — Ambulatory Visit (INDEPENDENT_AMBULATORY_CARE_PROVIDER_SITE_OTHER): Payer: 59 | Admitting: Sports Medicine

## 2020-10-27 ENCOUNTER — Ambulatory Visit (INDEPENDENT_AMBULATORY_CARE_PROVIDER_SITE_OTHER): Payer: 59

## 2020-10-27 DIAGNOSIS — S8992XA Unspecified injury of left lower leg, initial encounter: Secondary | ICD-10-CM

## 2020-10-27 DIAGNOSIS — E785 Hyperlipidemia, unspecified: Secondary | ICD-10-CM | POA: Diagnosis not present

## 2020-10-27 DIAGNOSIS — R030 Elevated blood-pressure reading, without diagnosis of hypertension: Secondary | ICD-10-CM

## 2020-10-27 DIAGNOSIS — I10 Essential (primary) hypertension: Secondary | ICD-10-CM | POA: Insufficient documentation

## 2020-10-27 DIAGNOSIS — K219 Gastro-esophageal reflux disease without esophagitis: Secondary | ICD-10-CM | POA: Diagnosis not present

## 2020-10-27 DIAGNOSIS — W03XXXA Other fall on same level due to collision with another person, initial encounter: Secondary | ICD-10-CM | POA: Diagnosis not present

## 2020-10-27 DIAGNOSIS — M940 Chondrocostal junction syndrome [Tietze]: Secondary | ICD-10-CM

## 2020-10-27 DIAGNOSIS — N529 Male erectile dysfunction, unspecified: Secondary | ICD-10-CM

## 2020-10-27 DIAGNOSIS — Z09 Encounter for follow-up examination after completed treatment for conditions other than malignant neoplasm: Secondary | ICD-10-CM

## 2020-10-27 DIAGNOSIS — M899 Disorder of bone, unspecified: Secondary | ICD-10-CM

## 2020-10-27 NOTE — Assessment & Plan Note (Signed)
We can recheck lipids at the follow-up visit in a month.

## 2020-10-27 NOTE — Assessment & Plan Note (Signed)
Resolved with diclofenac.

## 2020-10-27 NOTE — Assessment & Plan Note (Signed)
Finish least another couple weeks of pantoprazole and then stop medication, if GERD remains controlled he can stay off of it, if not we will probably get GI to take a look.

## 2020-10-27 NOTE — Assessment & Plan Note (Signed)
About 1 week ago Preston Lambert was playing kickball, he ran into another player and took a misstep, had immediate pain, swelling in his left knee, pain is at the medial joint line, he is ambulatory. On exam he has tenderness to the medial joint line, pain with valgus stress, positive Lachman sign. Concerned about medial meniscal, MCL, and ACL injuries. He will get a knee brace over-the-counter.   Adding some x-rays, he will continue his NSAIDs, and adding meniscal rehab. If no better in a month we will do an MRI.

## 2020-10-27 NOTE — Progress Notes (Addendum)
    Procedures performed today:    None.  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  Brief History, Exam, Impression, and Recommendations:    Costochondritis Resolved with diclofenac.  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  Hyperlipidemia LDL goal <160 We can recheck lipids at the follow-up visit in a month.  GERD (gastroesophageal reflux disease) Finish least another couple weeks of pantoprazole and then stop medication, if GERD remains controlled he can stay off of it, if not we will probably get GI to take a look.  Left knee injury About 1 week ago Baltazar was playing kickball, he ran into another player and took a misstep, had immediate pain, swelling in his left knee, pain is at the medial joint line, he is ambulatory. On exam he has tenderness to the medial joint line, pain with valgus stress, positive Lachman sign. Concerned about medial meniscal, MCL, and ACL injuries. He will get a knee brace over-the-counter.   Adding some x-rays, he will continue his NSAIDs, and adding meniscal rehab. If no better in a month we will do an MRI.    ___________________________________________ Preston Lambert. Benjamin Stain, M.D., ABFM., CAQSM. Primary Care and Sports Medicine [place] [facility]  Adjunct Instructor of Family Medicine  [employer]

## 2020-10-27 NOTE — Assessment & Plan Note (Signed)
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

## 2020-10-29 DIAGNOSIS — M899 Disorder of bone, unspecified: Secondary | ICD-10-CM | POA: Insufficient documentation

## 2020-10-29 NOTE — Assessment & Plan Note (Signed)
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

## 2020-10-29 NOTE — Addendum Note (Signed)
Addended by: Monica Becton on: 10/29/2020 09:43 AM   Modules accepted: Orders

## 2020-11-16 ENCOUNTER — Other Ambulatory Visit: Payer: Self-pay

## 2020-11-16 ENCOUNTER — Ambulatory Visit
Admission: RE | Admit: 2020-11-16 | Discharge: 2020-11-16 | Disposition: A | Discharge status: Home / Self Care | Payer: 59 | Source: Ambulatory Visit | Attending: Sports Medicine | Admitting: Sports Medicine

## 2020-11-16 DIAGNOSIS — M899 Disorder of bone, unspecified: Secondary | ICD-10-CM

## 2020-11-16 MED ORDER — GADOBENATE DIMEGLUMINE 529 MG/ML IV SOLN
16.0000 mL | Freq: Once | INTRAVENOUS | Status: AC | PRN
  Administered 2020-11-16: 16 mL via INTRAVENOUS

## 2020-11-19 ENCOUNTER — Other Ambulatory Visit: Payer: 59

## 2020-11-24 ENCOUNTER — Other Ambulatory Visit: Payer: Self-pay

## 2020-11-24 ENCOUNTER — Ambulatory Visit (INDEPENDENT_AMBULATORY_CARE_PROVIDER_SITE_OTHER): Payer: 59 | Admitting: Sports Medicine

## 2020-11-24 DIAGNOSIS — E785 Hyperlipidemia, unspecified: Secondary | ICD-10-CM | POA: Diagnosis not present

## 2020-11-24 DIAGNOSIS — N529 Male erectile dysfunction, unspecified: Secondary | ICD-10-CM

## 2020-11-24 DIAGNOSIS — M899 Disorder of bone, unspecified: Secondary | ICD-10-CM

## 2020-11-24 DIAGNOSIS — S8992XD Unspecified injury of left lower leg, subsequent encounter: Secondary | ICD-10-CM | POA: Diagnosis not present

## 2020-11-24 DIAGNOSIS — I10 Essential (primary) hypertension: Secondary | ICD-10-CM

## 2020-11-24 MED ORDER — TADALAFIL 5 MG PO TABS: 5.0000 mg | ORAL_TABLET | Freq: Every day | ORAL | 11 refills | Status: DC

## 2020-11-24 MED ORDER — AMLODIPINE BESYLATE 5 MG PO TABS: 5.0000 mg | ORAL_TABLET | Freq: Every day | ORAL | 11 refills | Status: DC

## 2020-11-24 NOTE — Assessment & Plan Note (Signed)
Doing much better, MRI shows a simple MCL sprain, we can watch this for now.

## 2020-11-24 NOTE — Assessment & Plan Note (Signed)
Adding amlodipine 5, recheck at home and send me messages over the next 2 weeks.

## 2020-11-24 NOTE — Assessment & Plan Note (Signed)
Femoral MRI showed ossifying fibroma, benign, no further intervention needed.

## 2020-11-24 NOTE — Assessment & Plan Note (Signed)
Rechecking lipids. 

## 2020-11-24 NOTE — Progress Notes (Signed)
    Procedures performed today:    None.  Independent interpretation of notes and tests performed by another provider:   None.  Brief History, Exam, Impression, and Recommendations:    Left knee injury Doing much better, MRI shows a simple MCL sprain, we can watch this for now.  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  Hyperlipidemia LDL goal <160 Rechecking lipids  Benign essential hypertension Adding amlodipine 5, recheck at home and send me messages over the next 2 weeks.    ___________________________________________ Ihor Austin. Benjamin Stain, M.D., ABFM., CAQSM. Primary Care and Sports Medicine  [facility]  Adjunct Instructor of Family Medicine  [employer] [facility]

## 2020-11-25 LAB — LIPID PANEL
Cholesterol: 201 mg/dL — ABNORMAL HIGH (ref <200)
HDL: 59 mg/dL (ref >=40)
LDL Cholesterol (Calc): 117 mg/dL (calc) — ABNORMAL HIGH
Non-HDL Cholesterol (Calc): 142 mg/dL (calc) — ABNORMAL HIGH (ref <130)
Total CHOL/HDL Ratio: 3.4 (calc) (ref <5.0)
Triglycerides: 142 mg/dL (ref <150)

## 2021-02-08 ENCOUNTER — Telehealth: Payer: Self-pay

## 2021-02-08 DIAGNOSIS — E785 Hyperlipidemia, unspecified: Secondary | ICD-10-CM

## 2021-02-08 MED ORDER — ROSUVASTATIN CALCIUM 10 MG PO TABS: 10.0000 mg | ORAL_TABLET | Freq: Every day | ORAL | 3 refills | Status: DC

## 2021-02-08 NOTE — Telephone Encounter (Signed)
Called and made patient aware. 

## 2021-02-08 NOTE — Telephone Encounter (Signed)
Not a problem, calling in the new 10 mg dosage.

## 2021-02-08 NOTE — Telephone Encounter (Signed)
Patient states he is to double the amount of  Crestor he is taking but the pharmacy is needing new prescription with new instructions

## 2021-02-09 ENCOUNTER — Encounter: Payer: Self-pay | Admitting: Sports Medicine

## 2021-02-09 DIAGNOSIS — E785 Hyperlipidemia, unspecified: Secondary | ICD-10-CM

## 2021-02-09 MED ORDER — ROSUVASTATIN CALCIUM 10 MG PO TABS: 10.0000 mg | ORAL_TABLET | Freq: Every day | ORAL | 3 refills | Status: DC

## 2021-04-25 ENCOUNTER — Encounter: Payer: Self-pay | Admitting: Sports Medicine

## 2021-04-27 ENCOUNTER — Ambulatory Visit (INDEPENDENT_AMBULATORY_CARE_PROVIDER_SITE_OTHER): Payer: 59 | Admitting: Sports Medicine

## 2021-04-27 ENCOUNTER — Other Ambulatory Visit: Payer: Self-pay

## 2021-04-27 DIAGNOSIS — T700XXA Otitic barotrauma, initial encounter: Secondary | ICD-10-CM | POA: Diagnosis not present

## 2021-04-27 DIAGNOSIS — I10 Essential (primary) hypertension: Secondary | ICD-10-CM | POA: Diagnosis not present

## 2021-04-27 MED ORDER — PREDNISONE 50 MG PO TABS: 50.0000 mg | ORAL_TABLET | Freq: Every day | ORAL | 0 refills | Status: DC

## 2021-04-27 MED ORDER — PHENYLEPHRINE HCL 10 MG PO TABS: 10.0000 mg | ORAL_TABLET | Freq: Three times a day (TID) | ORAL | 0 refills | Status: DC

## 2021-04-27 NOTE — Assessment & Plan Note (Signed)
Continue amlodipine, blood pressures at home have been in the 120 systolic, no changes. ?He will monitor this as we treat him with a decongestant for the next 5 days. ?
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
## 2021-04-27 NOTE — Progress Notes (Signed)
? ? ?  Procedures performed today:   ? ?None. ? ?Independent interpretation of notes and tests performed by another provider:  ? ?None. ? ?Brief History, Exam, Impression, and Recommendations:   ? ?Barotitis media ?Pleasant 56 year old male, recent scuba diving trip, felt some pressure during descent right ear, tried to clear it but was unable. ?Since then he has had pain right ear, a tight sensation with occasional popping. ?No viral symptoms. ?On exam left and right canals and tympanic membranes are completely normal. ?I think he likely has barotitis media, adding steroids, as well as 5 days of a decongestant orally. ?Return to see me in approximately 2 to 4 weeks for reevaluation, if not better we will consider referral to ENT. ? ?Benign essential hypertension ?Continue amlodipine, blood pressures at home have been in the 123456 systolic, no changes. ?He will monitor this as we treat him with a decongestant for the next 5 days. ? ? ? ?___________________________________________ ?Gwen Her. Dianah Field, M.D., ABFM., CAQSM. ?Primary Care and Sports Medicine ?[place] ? ?Adjunct Instructor of Family Medicine  ?[employer] ?

## 2021-04-28 ENCOUNTER — Encounter: Payer: Self-pay | Admitting: Sports Medicine

## 2021-04-28 DIAGNOSIS — T700XXA Otitic barotrauma, initial encounter: Secondary | ICD-10-CM

## 2021-04-28 MED ORDER — PHENYLEPHRINE HCL 10 MG PO TABS: 10.0000 mg | ORAL_TABLET | Freq: Three times a day (TID) | ORAL | 0 refills | Status: AC

## 2021-05-25 ENCOUNTER — Ambulatory Visit (INDEPENDENT_AMBULATORY_CARE_PROVIDER_SITE_OTHER): Payer: 59 | Admitting: Sports Medicine

## 2021-05-25 DIAGNOSIS — T700XXD Otitic barotrauma, subsequent encounter: Secondary | ICD-10-CM | POA: Diagnosis not present

## 2021-05-25 DIAGNOSIS — I10 Essential (primary) hypertension: Secondary | ICD-10-CM

## 2021-05-25 NOTE — Assessment & Plan Note (Signed)
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 ?We noted a normal ear exam, added some prednisone and decongestants and symptoms have resolved, he will do his Ascent and descent much more slowly from now and when scuba diving. ?He is safe to fly. ?

## 2021-05-25 NOTE — Progress Notes (Signed)
? ? ?  Procedures performed today:   ? ?None. ? ?Independent interpretation of notes and tests performed by another provider:  ? ?None. ? ?Brief History, Exam, Impression, and Recommendations:   ? 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 ?We noted a normal ear exam, added some prednisone and decongestants and symptoms have resolved, he will do his Ascent and descent much more slowly from now and when scuba diving. ?He is safe to fly. ? ?Benign essential hypertension ?Doing okay on amlodipine 5, he does endorse some shortness of breath when working out, he will go ahead and get blood pressure and pulse rate while he is symptomatic and let me know what it looks like, in the meantime I advised him to wear some compression stockings, as well as use electrolyte-containing fluids when working out. ?We will keep an eye on this as his blood pressure was a bit elevated today. ? ? ? ?___________________________________________ ?Ihor Austin. Benjamin Stain, M.D., ABFM., CAQSM. ?Primary Care and Sports Medicine ?[place] [facility] ? ?Adjunct Instructor of Family Medicine  ?[employer] ?

## 2021-05-25 NOTE — Assessment & Plan Note (Signed)
Doing okay on amlodipine 5, he does endorse some shortness of breath when working out, he will go ahead and get blood pressure and pulse rate while he is symptomatic and let me know what it looks like, in the meantime I advised him to wear some compression stockings, as well as use electrolyte-containing fluids when working out. ?We will keep an eye on this as his blood pressure was a bit elevated today. ?

## 2021-07-02 ENCOUNTER — Encounter: Payer: Self-pay | Admitting: Sports Medicine

## 2021-08-23 ENCOUNTER — Encounter: Payer: Self-pay | Admitting: Sports Medicine

## 2021-12-22 ENCOUNTER — Other Ambulatory Visit: Payer: Self-pay | Admitting: Sports Medicine

## 2021-12-22 DIAGNOSIS — I10 Essential (primary) hypertension: Secondary | ICD-10-CM

## 2021-12-22 DIAGNOSIS — N529 Male erectile dysfunction, unspecified: Secondary | ICD-10-CM

## 2021-12-27 ENCOUNTER — Encounter: Payer: Self-pay | Admitting: Sports Medicine

## 2021-12-27 ENCOUNTER — Ambulatory Visit (INDEPENDENT_AMBULATORY_CARE_PROVIDER_SITE_OTHER): Payer: 59 | Admitting: Sports Medicine

## 2021-12-27 VITALS — BP 138/71 | HR 69 | Wt 179.0 lb

## 2021-12-27 DIAGNOSIS — N529 Male erectile dysfunction, unspecified: Secondary | ICD-10-CM

## 2021-12-27 DIAGNOSIS — Z0000 Encounter for general adult medical examination without abnormal findings: Secondary | ICD-10-CM | POA: Diagnosis not present

## 2021-12-27 DIAGNOSIS — I10 Essential (primary) hypertension: Secondary | ICD-10-CM | POA: Diagnosis not present

## 2021-12-27 DIAGNOSIS — N139 Obstructive and reflux uropathy, unspecified: Secondary | ICD-10-CM

## 2021-12-27 DIAGNOSIS — E785 Hyperlipidemia, unspecified: Secondary | ICD-10-CM

## 2021-12-27 MED ORDER — TADALAFIL 5 MG PO TABS: 5.0000 mg | ORAL_TABLET | Freq: Every day | ORAL | 3 refills | Status: DC

## 2021-12-27 NOTE — Assessment & Plan Note (Signed)
Well-controlled, no change in plan. 
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
## 2021-12-27 NOTE — Assessment & Plan Note (Signed)
Annual physical as above, checking routine labs. 

## 2022-02-12 ENCOUNTER — Other Ambulatory Visit: Payer: Self-pay | Admitting: Sports Medicine

## 2022-02-12 DIAGNOSIS — E785 Hyperlipidemia, unspecified: Secondary | ICD-10-CM

## 2022-03-12 ENCOUNTER — Other Ambulatory Visit: Payer: Self-pay | Admitting: Sports Medicine

## 2022-03-12 DIAGNOSIS — E785 Hyperlipidemia, unspecified: Secondary | ICD-10-CM

## 2022-04-05 ENCOUNTER — Other Ambulatory Visit: Payer: Self-pay | Admitting: Sports Medicine

## 2022-04-05 DIAGNOSIS — E785 Hyperlipidemia, unspecified: Secondary | ICD-10-CM

## 2022-04-05 MED ORDER — ROSUVASTATIN CALCIUM 20 MG PO TABS: 20.0000 mg | ORAL_TABLET | Freq: Every day | ORAL | 3 refills | Status: DC

## 2022-04-05 NOTE — Assessment & Plan Note (Signed)
Lipids still elevated, increasing rosuvastatin to 20 mg daily with a 72-monthrecheck, goal LDL less than 100.

## 2022-04-07 LAB — PSA, TOTAL AND FREE
PSA, % Free: 18 % (calc) — ABNORMAL LOW (ref >25)
PSA, Free: 0.4 ng/mL
PSA, Total: 2.2 ng/mL (ref <=4.0)

## 2022-04-07 LAB — CBC
HCT: 44.2 % (ref 38.5–50.0)
Hemoglobin: 15.3 g/dL (ref 13.2–17.1)
MCH: 32.6 pg (ref 27.0–33.0)
MCHC: 34.6 g/dL (ref 32.0–36.0)
MCV: 94 fL (ref 80.0–100.0)
MPV: 9.4 fL (ref 7.5–12.5)
Platelets: 203 10*3/uL (ref 140–400)
RBC: 4.7 10*6/uL (ref 4.20–5.80)
RDW: 12.5 % (ref 11.0–15.0)
WBC: 4.2 10*3/uL (ref 3.8–10.8)

## 2022-04-07 LAB — COMPLETE METABOLIC PANEL WITH GFR
AG Ratio: 2.5 (calc) (ref 1.0–2.5)
ALT: 27 U/L (ref 9–46)
AST: 22 U/L (ref 10–35)
Albumin: 4.8 g/dL (ref 3.6–5.1)
Alkaline phosphatase (APISO): 73 U/L (ref 35–144)
BUN: 13 mg/dL (ref 7–25)
CO2: 28 mmol/L (ref 20–32)
Calcium: 10.1 mg/dL (ref 8.6–10.3)
Chloride: 104 mmol/L (ref 98–110)
Creat: 0.9 mg/dL (ref 0.70–1.30)
Globulin: 1.9 g/dL (calc) (ref 1.9–3.7)
Glucose, Bld: 98 mg/dL (ref 65–99)
Potassium: 4 mmol/L (ref 3.5–5.3)
Sodium: 143 mmol/L (ref 135–146)
Total Bilirubin: 0.7 mg/dL (ref 0.2–1.2)
Total Protein: 6.7 g/dL (ref 6.1–8.1)
eGFR: 99 mL/min/{1.73_m2} (ref >=60)

## 2022-04-07 LAB — LIPID PANEL
Cholesterol: 234 mg/dL — ABNORMAL HIGH (ref <200)
HDL: 74 mg/dL (ref >=40)
LDL Cholesterol (Calc): 132 mg/dL (calc) — ABNORMAL HIGH
Non-HDL Cholesterol (Calc): 160 mg/dL (calc) — ABNORMAL HIGH (ref <130)
Total CHOL/HDL Ratio: 3.2 (calc) (ref <5.0)
Triglycerides: 149 mg/dL (ref <150)

## 2022-04-07 LAB — TSH: TSH: 1.6 mIU/L (ref 0.40–4.50)

## 2022-04-12 ENCOUNTER — Ambulatory Visit (INDEPENDENT_AMBULATORY_CARE_PROVIDER_SITE_OTHER): Payer: 59 | Admitting: Sports Medicine

## 2022-04-12 ENCOUNTER — Encounter: Payer: Self-pay | Admitting: Sports Medicine

## 2022-04-12 VITALS — BP 131/79 | HR 66 | Ht 69.0 in | Wt 182.0 lb

## 2022-04-12 DIAGNOSIS — Z0289 Encounter for other administrative examinations: Secondary | ICD-10-CM | POA: Insufficient documentation

## 2022-04-12 LAB — POCT URINALYSIS DIP (CLINITEK)
Bilirubin, UA: NEGATIVE
Blood, UA: NEGATIVE
Glucose, UA: NEGATIVE mg/dL
Ketones, POC UA: NEGATIVE mg/dL
Leukocytes, UA: NEGATIVE
Nitrite, UA: NEGATIVE
POC PROTEIN,UA: NEGATIVE
Spec Grav, UA: 1.02 (ref 1.010–1.025)
Urobilinogen, UA: 0.2 E.U./dL
pH, UA: 7 (ref 5.0–8.0)

## 2022-04-12 NOTE — Assessment & Plan Note (Signed)
Class II FAA medical performed, further information in the [place] system.

## 2022-04-12 NOTE — Progress Notes (Signed)
    Procedures performed today:    None.  Independent interpretation of notes and tests performed by another provider:   None.  Brief History, Exam, Impression, and Recommendations:    Encounter for [employer] ([place]) examination Class II FAA medical performed, further information in the [place] system.  I spent 30 minutes of total time managing this patient today, this includes chart review, face to face, and non-face to face time.  ____________________________________________ Gwen Her. Dianah Field, M.D., ABFM., CAQSM., AME. Primary Care and Sports Medicine [place] [facility]  Adjunct Professor of [place] [employer] [facility] of Medicine  Risk manager

## 2022-05-08 ENCOUNTER — Other Ambulatory Visit: Payer: Self-pay | Admitting: Sports Medicine

## 2022-05-08 DIAGNOSIS — E785 Hyperlipidemia, unspecified: Secondary | ICD-10-CM

## 2022-06-04 ENCOUNTER — Other Ambulatory Visit: Payer: Self-pay | Admitting: Sports Medicine

## 2022-06-04 DIAGNOSIS — E785 Hyperlipidemia, unspecified: Secondary | ICD-10-CM

## 2022-07-11 ENCOUNTER — Ambulatory Visit (INDEPENDENT_AMBULATORY_CARE_PROVIDER_SITE_OTHER): Payer: 59 | Admitting: Sports Medicine

## 2022-07-11 DIAGNOSIS — S83241A Other tear of medial meniscus, current injury, right knee, initial encounter: Secondary | ICD-10-CM | POA: Diagnosis not present

## 2022-07-11 DIAGNOSIS — S83206A Unspecified tear of unspecified meniscus, current injury, right knee, initial encounter: Secondary | ICD-10-CM | POA: Insufficient documentation

## 2022-07-11 MED ORDER — MELOXICAM 15 MG PO TABS
ORAL_TABLET | ORAL | 3 refills | Status: AC
Start: 2022-07-11 — End: ?

## 2022-07-11 NOTE — Assessment & Plan Note (Signed)
This is a very pleasant 57 year old male, he has a history of some knee osteoarthritis with occasional gelling, more recently he went dancing, over a month ago, then had increasing pain medial joint line, mechanical symptoms, on exam he has a positive McMurray's sign, tenderness at the medial joint line, mild effusion, he also has some patellofemoral crepitus. He is taking meloxicam, unfortunately not improving, continue brace, refilling meloxicam, he had x-rays recently, proceeding with MRI. Depending on what we see he would likely be a candidate for an injection.

## 2022-07-11 NOTE — Progress Notes (Signed)
    Procedures performed today:    None.  Independent interpretation of notes and tests performed by another provider:   None.  Brief History, Exam, Impression, and Recommendations:    Right knee meniscal tear This is a very pleasant 57 year old male, he has a history of some knee osteoarthritis with occasional gelling, more recently he went dancing, over a month ago, then had increasing pain medial joint line, mechanical symptoms, on exam he has a positive McMurray's sign, tenderness at the medial joint line, mild effusion, he also has some patellofemoral crepitus. He is taking meloxicam, unfortunately not improving, continue brace, refilling meloxicam, he had x-rays recently, proceeding with MRI. Depending on what we see he would likely be a candidate for an injection.    ____________________________________________ Ihor Austin. Benjamin Stain, M.D., ABFM., CAQSM., AME. Primary Care and Sports Medicine [place] [facility]  Adjunct Professor of Family Medicine  [place] of [facility] of Medicine  Restaurant manager, fast food

## 2022-07-25 ENCOUNTER — Ambulatory Visit (INDEPENDENT_AMBULATORY_CARE_PROVIDER_SITE_OTHER): Payer: 59

## 2022-07-25 DIAGNOSIS — S83241A Other tear of medial meniscus, current injury, right knee, initial encounter: Secondary | ICD-10-CM

## 2022-08-01 ENCOUNTER — Encounter: Payer: Self-pay | Admitting: Sports Medicine

## 2022-08-03 ENCOUNTER — Encounter: Payer: Self-pay | Admitting: Sports Medicine

## 2022-08-03 DIAGNOSIS — S83241A Other tear of medial meniscus, current injury, right knee, initial encounter: Secondary | ICD-10-CM

## 2022-08-14 ENCOUNTER — Ambulatory Visit (INDEPENDENT_AMBULATORY_CARE_PROVIDER_SITE_OTHER): Payer: 59 | Admitting: Sports Medicine

## 2022-08-14 DIAGNOSIS — S83241A Other tear of medial meniscus, current injury, right knee, initial encounter: Secondary | ICD-10-CM | POA: Diagnosis not present

## 2022-08-14 NOTE — Progress Notes (Signed)
    Procedures performed today:    None.  Independent interpretation of notes and tests performed by another provider:   None.  Brief History, Exam, Impression, and Recommendations:    Right knee meniscal tear Pleasant 57 year old male, knee osteoarthritis with gelling, went dancing approximately 2 months ago, had increasing pain medial joint line with mechanical symptoms, we obtained an MRI that did show degenerative changes and meniscal tearing,. He has responded well to conservative treatment and feels almost no pain now. I have advised him to do some aggressive physical therapy, and he can return to see me as needed.    ____________________________________________ Ihor Austin. Benjamin Stain, M.D., ABFM., CAQSM., AME. Primary Care and Sports Medicine [place] [facility]  Adjunct Professor of Family Medicine  [place] of [facility] [employer] Medicine  Restaurant manager, fast food

## 2022-08-14 NOTE — Assessment & Plan Note (Addendum)
Pleasant 57 year old male, knee osteoarthritis with gelling, went dancing approximately 2 months ago, had increasing pain medial joint line with mechanical symptoms, we obtained an MRI that did show degenerative changes and meniscal tearing,. He has responded well to conservative treatment and feels almost no pain now. I have advised him to do some aggressive physical therapy, and he can return to see me as needed.

## 2022-08-15 ENCOUNTER — Other Ambulatory Visit: Payer: Self-pay | Admitting: Sports Medicine

## 2022-08-15 DIAGNOSIS — I10 Essential (primary) hypertension: Secondary | ICD-10-CM

## 2023-03-29 ENCOUNTER — Encounter: Payer: Self-pay | Admitting: Sports Medicine

## 2023-03-29 DIAGNOSIS — N529 Male erectile dysfunction, unspecified: Secondary | ICD-10-CM

## 2023-03-29 MED ORDER — TADALAFIL 5 MG PO TABS
5.0000 mg | ORAL_TABLET | Freq: Every day | ORAL | 3 refills | Status: AC
Start: 2023-03-29 — End: ?

## 2023-05-13 ENCOUNTER — Other Ambulatory Visit: Payer: Self-pay | Admitting: Sports Medicine

## 2023-05-13 DIAGNOSIS — E785 Hyperlipidemia, unspecified: Secondary | ICD-10-CM

## 2023-05-14 ENCOUNTER — Other Ambulatory Visit: Payer: Self-pay | Admitting: Sports Medicine

## 2023-05-14 DIAGNOSIS — E785 Hyperlipidemia, unspecified: Secondary | ICD-10-CM

## 2023-06-09 ENCOUNTER — Other Ambulatory Visit: Payer: Self-pay | Admitting: Sports Medicine

## 2023-06-09 DIAGNOSIS — I10 Essential (primary) hypertension: Secondary | ICD-10-CM

## 2023-06-11 ENCOUNTER — Other Ambulatory Visit: Payer: Self-pay | Admitting: Sports Medicine

## 2023-06-11 DIAGNOSIS — E785 Hyperlipidemia, unspecified: Secondary | ICD-10-CM

## 2023-06-11 MED ORDER — ROSUVASTATIN CALCIUM 20 MG PO TABS: 20.0000 mg | ORAL_TABLET | Freq: Every day | ORAL | 0 refills | Status: DC

## 2023-06-11 NOTE — Telephone Encounter (Signed)
 Requesting rx rf of crestor  20mg   Last written 05/14/2023 Last labs 04/04/2022 Last OV 08/14/2022 Upcoming appt = none

## 2023-08-01 ENCOUNTER — Other Ambulatory Visit: Payer: Self-pay | Admitting: Sports Medicine

## 2023-08-01 DIAGNOSIS — E785 Hyperlipidemia, unspecified: Secondary | ICD-10-CM

## 2023-08-01 MED ORDER — ROSUVASTATIN CALCIUM 20 MG PO TABS
20.0000 mg | ORAL_TABLET | Freq: Every day | ORAL | 3 refills | Status: AC
Start: 2023-08-01 — End: ?

## 2023-09-13 ENCOUNTER — Encounter: Payer: Self-pay | Admitting: Sports Medicine

## 2023-09-20 ENCOUNTER — Ambulatory Visit (INDEPENDENT_AMBULATORY_CARE_PROVIDER_SITE_OTHER): Admitting: Sports Medicine

## 2023-09-20 VITALS — BP 145/82 | HR 59 | Ht 69.0 in | Wt 182.0 lb

## 2023-09-20 DIAGNOSIS — Z0000 Encounter for general adult medical examination without abnormal findings: Secondary | ICD-10-CM | POA: Diagnosis not present

## 2023-09-20 DIAGNOSIS — I10 Essential (primary) hypertension: Secondary | ICD-10-CM

## 2023-09-20 DIAGNOSIS — N139 Obstructive and reflux uropathy, unspecified: Secondary | ICD-10-CM | POA: Diagnosis not present
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
## 2023-09-20 NOTE — Assessment & Plan Note (Signed)
 Fasting annual physical as above, labs ordered, per patient request ordering a coronary calcium  score. His wife was asking about low-dose lung CT screening and bone density scan. He is not a smoker, and in the setting of a 58 year old male the bone density test is not recommended in the absence of a pathologic fracture.

## 2023-09-21 ENCOUNTER — Ambulatory Visit: Payer: Self-pay | Admitting: Sports Medicine

## 2023-09-22 LAB — PSA, TOTAL AND FREE
PSA, Free Pct: 17.1 %
PSA, Free: 0.6 ng/mL
Prostate Specific Ag, Serum: 3.5 ng/mL (ref 0.0–4.0)

## 2023-09-22 LAB — COMPREHENSIVE METABOLIC PANEL WITH GFR
ALT: 34 IU/L (ref 0–44)
AST: 29 IU/L (ref 0–40)
Albumin: 4.6 g/dL (ref 3.8–4.9)
Alkaline Phosphatase: 81 IU/L (ref 44–121)
BUN/Creatinine Ratio: 16 (ref 10–24)
BUN: 14 mg/dL (ref 8–27)
Bilirubin Total: 0.6 mg/dL (ref 0.0–1.2)
CO2: 21 mmol/L (ref 20–29)
Calcium: 9.3 mg/dL (ref 8.6–10.2)
Chloride: 103 mmol/L (ref 96–106)
Creatinine, Ser: 0.88 mg/dL (ref 0.76–1.27)
Globulin, Total: 1.9 g/dL (ref 1.5–4.5)
Glucose: 91 mg/dL (ref 70–99)
Potassium: 4.4 mmol/L (ref 3.5–5.2)
Sodium: 139 mmol/L (ref 134–144)
Total Protein: 6.5 g/dL (ref 6.0–8.5)
eGFR: 98 mL/min/1.73 (ref >59)

## 2023-09-22 LAB — HEMOGLOBIN A1C
Est. average glucose Bld gHb Est-mCnc: 105 mg/dL
Hgb A1c MFr Bld: 5.3 % (ref 4.8–5.6)

## 2023-09-22 LAB — LIPID PANEL
Chol/HDL Ratio: 3.1 ratio (ref 0.0–5.0)
Cholesterol, Total: 186 mg/dL (ref 100–199)
HDL: 60 mg/dL (ref >39)
LDL Chol Calc (NIH): 102 mg/dL — ABNORMAL HIGH (ref 0–99)
Triglycerides: 140 mg/dL (ref 0–149)
VLDL Cholesterol Cal: 24 mg/dL (ref 5–40)

## 2023-09-22 LAB — CBC
Hematocrit: 45.1 % (ref 37.5–51.0)
Hemoglobin: 15.4 g/dL (ref 13.0–17.7)
MCH: 33.9 pg — ABNORMAL HIGH (ref 26.6–33.0)
MCHC: 34.1 g/dL (ref 31.5–35.7)
MCV: 99 fL — ABNORMAL HIGH (ref 79–97)
Platelets: 197 x10E3/uL (ref 150–450)
RBC: 4.54 x10E6/uL (ref 4.14–5.80)
RDW: 12.7 % (ref 11.6–15.4)
WBC: 4.6 x10E3/uL (ref 3.4–10.8)

## 2023-09-22 LAB — TSH: TSH: 1.57 u[IU]/mL (ref 0.450–4.500)

## 2023-10-09 ENCOUNTER — Encounter: Payer: Self-pay | Admitting: Sports Medicine

## 2023-10-16 ENCOUNTER — Other Ambulatory Visit: Payer: Self-pay | Admitting: Sports Medicine

## 2023-10-16 DIAGNOSIS — I10 Essential (primary) hypertension: Secondary | ICD-10-CM

## 2023-10-16 MED ORDER — AMLODIPINE BESYLATE 5 MG PO TABS: 5.0000 mg | ORAL_TABLET | Freq: Every day | ORAL | 0 refills | Status: DC

## 2023-10-16 NOTE — Telephone Encounter (Signed)
 Copied from CRM [id number]. Topic: Clinical - Medication Refill >> Oct 16, 2023 12:58 PM Dominique A wrote: Medication: amLODipine  (NORVASC ) 5 MG tablet  Has the patient contacted their pharmacy? Yes.   This is the patient's preferred pharmacy:   [facility] [address], [place] - [address] [place] [address] Phone: [phone] Fax: [phone]  Is this the correct pharmacy for this prescription? Yes If no, delete pharmacy and type the correct one.   Has the prescription been filled recently? No  Is the patient out of the medication? No. Patient will take his last pill tonight.   Has the patient been seen for an appointment in the last year OR does the patient have an upcoming appointment? Yes  Can we respond through MyChart? No Please call Mrs. Okey [phone].  Agent: Please be advised that Rx refills may take up to 3 business days. We ask that you follow-up with your pharmacy.

## 2024-01-17 ENCOUNTER — Other Ambulatory Visit: Payer: Self-pay | Admitting: Physician Assistant

## 2024-01-17 DIAGNOSIS — I10 Essential (primary) hypertension: Secondary | ICD-10-CM

## 2024-10-07 ENCOUNTER — Encounter: Admitting: Sports Medicine

## 2024-10-07 ENCOUNTER — Encounter: Admitting: Urgent Care
